# Patient Record
Sex: Female | Born: 1965 | Race: Black or African American | Hispanic: No | Smoking: Never smoker
Health system: Southern US, Community
[De-identification: ages and names within clinical notes are randomized; demographics above are authoritative.]

## PROBLEM LIST (undated history)

## (undated) HISTORY — PX: APPENDECTOMY: SHX54

## (undated) HISTORY — PX: JOINT REPLACEMENT: SHX530

## (undated) HISTORY — PX: BACK SURGERY: SHX140

---

## 2005-02-02 ENCOUNTER — Ambulatory Visit: Payer: Self-pay | Admitting: General Practice

## 2005-03-02 ENCOUNTER — Ambulatory Visit: Payer: Self-pay | Admitting: General Practice

## 2006-05-24 ENCOUNTER — Ambulatory Visit: Payer: Self-pay | Admitting: General Surgery

## 2006-06-02 ENCOUNTER — Ambulatory Visit: Payer: Self-pay | Admitting: General Surgery

## 2013-05-21 ENCOUNTER — Emergency Department: Payer: Self-pay | Admitting: Emergency Medicine
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
## 2015-08-21 ENCOUNTER — Ambulatory Visit
Admission: RE | Admit: 2015-08-21 | Discharge: 2015-08-21 | Disposition: A | Discharge status: Home / Self Care | Payer: BLUE CROSS/BLUE SHIELD | Source: Ambulatory Visit | Attending: Obstetrics and Gynecology | Admitting: Obstetrics and Gynecology

## 2015-08-21 DIAGNOSIS — R002 Palpitations: Secondary | ICD-10-CM | POA: Insufficient documentation

## 2016-10-06 ENCOUNTER — Ambulatory Visit
Admission: EM | Admit: 2016-10-06 | Discharge: 2016-10-06 | Disposition: A | Discharge status: Home / Self Care | Payer: BLUE CROSS/BLUE SHIELD | Attending: Family Medicine | Admitting: Family Medicine

## 2016-10-06 ENCOUNTER — Encounter: Payer: Self-pay | Admitting: *Deleted

## 2016-10-06 DIAGNOSIS — M722 Plantar fascial fibromatosis: Secondary | ICD-10-CM

## 2016-10-06 DIAGNOSIS — M79671 Pain in right foot: Secondary | ICD-10-CM

## 2016-10-06 MED ORDER — ETODOLAC 500 MG PO TABS
500.0000 mg | ORAL_TABLET | Freq: Two times a day (BID) | ORAL | 0 refills | Status: DC | PRN
Start: 2016-10-06 — End: 2018-08-05

## 2016-10-06 NOTE — ED Triage Notes (Signed)
Gradual onset right heel pain over past 2 weeks. Worse today. Denies injury.

## 2016-10-06 NOTE — Discharge Instructions (Signed)
Take medication as prescribed. Rest. Drink plenty of fluids. Ice. Wear good supportive shoes. Follow up with podiatry for continued pain.   Follow up with your primary care physician this week as needed. Return to Urgent care for new or worsening concerns.

## 2016-10-06 NOTE — ED Provider Notes (Signed)
[facility] ____________________________________________  Time seen: Approximately 7:59 PM  I have reviewed the triage vital signs and the nursing notes.   HISTORY  Chief Complaint Foot Pain   HPI Mindy Mack is a 51 y.o. female presenting for evaluation of right heel pain that is been present for just over 2 weeks. Patient states pain is a throbbing pain with intermittent sharp pains directly into the right heel. Denies any fall, injury or trauma. Reports that she does usually work 6-7 days a week on a hard surface while wearing stilted shoes. Denies history of similar in the past. Reports she does have a history of osteoarthritis with a past bilateral total knee replacements. Denies other chronic medical problems. Reports she has taken some over-the-counter Aleve without resolution. Has not been taking any medications on a regular basis. Reports has continued to work but did leave work early today. Denies any paresthesias, pain radiation, redness, insect bite, calf pain or leg swelling. Denies history of PE or DVT. Denies recent immobilization. Denies chest pain, shortness of breath, abdominal pain, dysuria,extremity swelling or rash. Denies recent sickness. Denies recent antibiotic use.   Patient's last menstrual period was 09/15/2016 (approximate).Denies pregnancy.    History reviewed. No pertinent past medical history. Osteoarthritis  There are no active problems to display for this patient.   Past Surgical History:  Procedure Laterality Date  . APPENDECTOMY    . CESAREAN SECTION    . JOINT REPLACEMENT       No current facility-administered medications for this encounter.   Current Outpatient Prescriptions:  .  etodolac (LODINE) 500 MG tablet, Take 1 tablet (500 mg total) by mouth 2 (two) times daily as needed (pain)., Disp: 14 tablet, Rfl: 0  Allergies Patient has no known allergies.  History reviewed. No pertinent family history.  Social  History Social History  Substance Use Topics  . Smoking status: Never Smoker  . Smokeless tobacco: Never Used  . Alcohol use Yes    Review of Systems Constitutional: No fever/chills Cardiovascular: Denies chest pain. Respiratory: Denies shortness of breath. Gastrointestinal: No abdominal pain.   Genitourinary: Negative for dysuria. Musculoskeletal: Negative for back pain. As above.  Skin: Negative for rash.   ____________________________________________   PHYSICAL EXAM:  VITAL SIGNS: ED Triage Vitals  Enc Vitals Group     BP 10/06/16 1921 119/65     Pulse Rate 10/06/16 1921 75     Resp 10/06/16 1921 16     Temp 10/06/16 1921 98.4 F (36.9 C)     Temp Source 10/06/16 1921 Oral     SpO2 10/06/16 1921 98 %     Weight 10/06/16 1922 265 lb (120.2 kg)     Height 10/06/16 1922 5\' 5"  (1.651 m)     Head Circumference --      Peak Flow --      Pain Score 10/06/16 1924 8     Pain Loc --      Pain Edu? --      Excl. in [place]? --     Constitutional: Alert and oriented. Well appearing and in no acute distress. Cardiovascular: Normal rate, regular rhythm. Grossly normal heart sounds.  Good peripheral circulation. Respiratory: Normal respiratory effort without tachypnea nor retractions. Breath sounds are clear and equal bilaterally. No wheezes, rales, rhonchi. Gastrointestinal: Soft and nontender Musculoskeletal: No midline cervical, thoracic or lumbar tenderness to palpation. Bilateral pedal pulses equal and easily palpated. Except: Right plantar aspect of right heel point mild to moderate  tenderness to direct palpation, no malleolar tenderness, no calf tenderness bilaterally, no edema noted to bilateral distal lower extremities, no Achilles tenderness, full range of motion present, right foot and right lower extremity otherwise nontender, right foot normal distal sensation and capillary refill. Neurologic:  Normal speech and language.  Speech is normal. No gait instability.  Skin:   Skin is warm, dry Psychiatric: Mood and affect are normal. Speech and behavior are normal. Patient exhibits appropriate insight and judgment   ___________________________________________   LABS (all labs ordered are listed, but only abnormal results are displayed)  Labs Reviewed - No data to display  RADIOLOGY  No results found. ____________________________________________   PROCEDURES Procedures    INITIAL IMPRESSION / ASSESSMENT AND PLAN / ED COURSE  Pertinent labs & imaging results that were available during my care of the patient were reviewed by me and considered in my medical decision making (see chart for details).  Plan patient. No acute distress. Denies trauma or injury. Suspect right plantar fasciitis. Discussed with patient, patient declines x-ray at this time and will seek x-ray evaluation if pain continues. Discussed with patient supportive and conservative management and including good supportive shoes, splints, stretching, ice and will treat with twice daily etodolac. Discussed patient follow-up with podiatry as needed for continued pain. Work note given for today and tomorrow.Discussed indication, risks and benefits of medications with patient.  Discussed follow up with Primary care physician this week. Discussed follow up and return parameters including no resolution or any worsening concerns. Patient verbalized understanding and agreed to plan.   ____________________________________________   FINAL CLINICAL IMPRESSION(S) / ED DIAGNOSES  Final diagnoses:  Plantar fasciitis of right foot     New Prescriptions   ETODOLAC (LODINE) 500 MG TABLET    Take 1 tablet (500 mg total) by mouth 2 (two) times daily as needed (pain).    Note: This dictation was prepared with Dragon dictation along with smaller phrase technology. Any transcriptional errors that result from this process are unintentional.         Marylene Land, NP 10/06/16 2055

## 2017-04-27 ENCOUNTER — Other Ambulatory Visit: Payer: Self-pay

## 2017-04-27 ENCOUNTER — Emergency Department
Admission: EM | Admit: 2017-04-27 | Discharge: 2017-04-27 | Disposition: A | Discharge status: Home / Self Care | Payer: BLUE CROSS/BLUE SHIELD | Attending: Emergency Medicine | Admitting: Emergency Medicine

## 2017-04-27 DIAGNOSIS — Z9660 Presence of unspecified orthopedic joint implant: Secondary | ICD-10-CM | POA: Insufficient documentation

## 2017-04-27 DIAGNOSIS — R509 Fever, unspecified: Secondary | ICD-10-CM | POA: Diagnosis present

## 2017-04-27 DIAGNOSIS — J111 Influenza due to unidentified influenza virus with other respiratory manifestations: Secondary | ICD-10-CM

## 2017-04-27 DIAGNOSIS — Z79899 Other long term (current) drug therapy: Secondary | ICD-10-CM | POA: Insufficient documentation

## 2017-04-27 MED ORDER — OSELTAMIVIR PHOSPHATE 75 MG PO CAPS: 75.0000 mg | ORAL_CAPSULE | Freq: Two times a day (BID) | ORAL | 0 refills | Status: AC

## 2017-04-27 MED ORDER — BENZONATATE 100 MG PO CAPS: 100.0000 mg | ORAL_CAPSULE | Freq: Three times a day (TID) | ORAL | 0 refills | Status: AC | PRN

## 2017-04-27 NOTE — ED Triage Notes (Signed)
Pt states sunday started with sore throat and cold symptoms. Taking nyquil and dayquil. Sounds congested. Body aches. HA. Decreased appetite, body aches. Coughing. Diarrhea last night, states vomited today. Alert, oriented, ambulatory. Speaking in complete sentences, no distress noted.

## 2017-04-27 NOTE — ED Notes (Signed)
See triage note  Presents with runny nose,fever and chills since Sunday night at work states she has tried OTC meds w/o much relief  afebrile on arrival

## 2017-04-27 NOTE — ED Provider Notes (Signed)
[facility] Emergency Department Provider Note  ____________________________________________  Time seen: Approximately 6:51 PM  I have reviewed the triage vital signs and the nursing notes.   HISTORY  Chief Complaint Influenza    HPI Lavelle Berland is a 52 y.o. female presents to the emergency department with rhinorrhea, headache, congestion, non-productive cough, myalgias, fever and chills for the past two days. No recent travel. Patient denies chest pain, chest tightness and vomiting. No alleviating measures have been attempted.    History reviewed. No pertinent past medical history.  There are no active problems to display for this patient.   Past Surgical History:  Procedure Laterality Date  . APPENDECTOMY    . CESAREAN SECTION    . JOINT REPLACEMENT      Prior to Admission medications   Medication Sig Start Date End Date Taking? Authorizing Provider  benzonatate (TESSALON PERLES) 100 MG capsule Take 1 capsule (100 mg total) by mouth 3 (three) times daily as needed for up to 7 days for cough. 04/27/17 05/04/17  Lannie Fields, PA-C  etodolac (LODINE) 500 MG tablet Take 1 tablet (500 mg total) by mouth 2 (two) times daily as needed (pain). 10/06/16   Marylene Land, NP  oseltamivir (TAMIFLU) 75 MG capsule Take 1 capsule (75 mg total) by mouth 2 (two) times daily for 5 days. 04/27/17 05/02/17  Lannie Fields, PA-C    Allergies Patient has no known allergies.  History reviewed. No pertinent family history.  Social History Social History   Tobacco Use  . Smoking status: Never Smoker  . Smokeless tobacco: Never Used  Substance Use Topics  . Alcohol use: Yes  . Drug use: No     Review of Systems  Constitutional: Patient has fever.  Eyes: No visual changes. No discharge ENT: Patient has congestion.  Cardiovascular: no chest pain. Respiratory: Patient has cough.  Gastrointestinal: No abdominal pain.  No nausea, no vomiting. Patient  had diarrhea.  Genitourinary: Negative for dysuria. No hematuria Musculoskeletal: Patient has myalgias.  Skin: Negative for rash, abrasions, lacerations, ecchymosis. Neurological: Patient has headache, no focal weakness or numbness.     ____________________________________________   PHYSICAL EXAM:  VITAL SIGNS: ED Triage Vitals [04/27/17 1425]  Enc Vitals Group     BP 134/82     Pulse Rate 87     Resp 16     Temp 98.2 F (36.8 C)     Temp Source Oral     SpO2 98 %     Weight 275 lb (124.7 kg)     Height 5\' 5"  (1.651 m)     Head Circumference      Peak Flow      Pain Score 3     Pain Loc      Pain Edu?      Excl. in [place]?      Constitutional: Alert and oriented. Patient is lying supine. Eyes: Conjunctivae are normal. PERRL. EOMI. Head: Atraumatic. ENT:      Ears: Tympanic membranes are mildly injected with mild effusion bilaterally.       Nose: No congestion/rhinnorhea.      Mouth/Throat: Mucous membranes are moist. Posterior pharynx is mildly erythematous.  Hematological/Lymphatic/Immunilogical: No cervical lymphadenopathy.  Cardiovascular: Normal rate, regular rhythm. Normal S1 and S2.  Good peripheral circulation. Respiratory: Normal respiratory effort without tachypnea or retractions. Lungs CTAB. Good air entry to the bases with no decreased or absent breath sounds. Gastrointestinal: Bowel sounds 4 quadrants. Soft and nontender to  palpation. No guarding or rigidity. No palpable masses. No distention. No CVA tenderness. Musculoskeletal: Full range of motion to all extremities. No gross deformities appreciated. Neurologic:  Normal speech and language. No gross focal neurologic deficits are appreciated.  Skin:  Skin is warm, dry and intact. No rash noted. Psychiatric: Mood and affect are normal. Speech and behavior are normal. Patient exhibits appropriate insight and judgement.    ____________________________________________   LABS (all labs ordered are listed,  but only abnormal results are displayed)  Labs Reviewed - No data to display ____________________________________________  EKG   ____________________________________________  RADIOLOGY   No results found.  ____________________________________________    PROCEDURES  Procedure(s) performed:    Procedures    Medications - No data to display   ____________________________________________   INITIAL IMPRESSION / ASSESSMENT AND PLAN / ED COURSE  Pertinent labs & imaging results that were available during my care of the patient were reviewed by me and considered in my medical decision making (see chart for details).  Review of the [place] CSRS was performed in accordance of the [place] prior to dispensing any controlled drugs.     Assessment and Plan:  Influenza A:  Patient presents to the emergency department with headache, rhinorrhea, congestion, pharyngitis and body aches.  Differential diagnosis included influenza versus unspecified viral URI.  History and physical exam findings are consistent with influenza at this time.  Patient was discharged with Tamiflu.  Rest and hydration were encouraged.  Vital signs are reassuring prior to discharge.  All patient questions were answered.    ____________________________________________  FINAL CLINICAL IMPRESSION(S) / ED DIAGNOSES  Final diagnoses:  Influenza      NEW MEDICATIONS STARTED DURING THIS VISIT:  ED Discharge Orders        Ordered    benzonatate (TESSALON PERLES) 100 MG capsule  3 times daily PRN     04/27/17 1611    oseltamivir (TAMIFLU) 75 MG capsule  2 times daily     04/27/17 1611          This chart was dictated using voice recognition software/Dragon. Despite best efforts to proofread, errors can occur which can change the meaning. Any change was purely unintentional.    Lannie Fields, PA-C 04/27/17 1858    Orbie Pyo, MD 04/27/17 2200

## 2018-08-05 ENCOUNTER — Encounter: Payer: Self-pay | Admitting: Emergency Medicine

## 2018-08-05 ENCOUNTER — Ambulatory Visit
Admission: EM | Admit: 2018-08-05 | Discharge: 2018-08-05 | Disposition: A | Discharge status: Home / Self Care | Payer: BLUE CROSS/BLUE SHIELD | Attending: Emergency Medicine | Admitting: Emergency Medicine

## 2018-08-05 ENCOUNTER — Other Ambulatory Visit: Payer: Self-pay

## 2018-08-05 DIAGNOSIS — L237 Allergic contact dermatitis due to plants, except food: Secondary | ICD-10-CM

## 2018-08-05 MED ORDER — PREDNISONE 10 MG PO TABS: ORAL_TABLET | ORAL | 0 refills | Status: DC

## 2018-08-05 NOTE — ED Triage Notes (Signed)
Pt c/o rash that she thinks is poison oak. Located on bilateral forearms. Started about 3 days ago. She was outside doing yard work the day before this started.

## 2018-08-05 NOTE — Discharge Instructions (Addendum)
Take medication as prescribed. Keep clean. Avoid trigger. Avoid scratching.   Follow up with your primary care physician this week as needed. Return to Urgent care for new or worsening concerns.

## 2018-08-05 NOTE — ED Provider Notes (Signed)
[facility] ____________________________________________  Time seen: Approximately 1:16 PM  I have reviewed the triage vital signs and the nursing notes.   HISTORY  Chief Complaint Rash   HPI Mindy Mack is a 53 y.o. female for evaluation of itchy rash to bilateral forearms that is starting to spread.  States rash is been present for the last 3 to 4 days.  States that she was working in her yard wearing gloves and cut down vines and pulling poison oak and ivy.  States has been putting some topical over-the-counter poison ivy cream without resolution.  Continues to spread.  Very itchy.  Denies pain.  No rash.  Not diabetic.  Denies renal insufficiency.  Reports otherwise doing well.   History reviewed. No pertinent past medical history.  There are no active problems to display for this patient.   Past Surgical History:  Procedure Laterality Date  . APPENDECTOMY    . CESAREAN SECTION    . JOINT REPLACEMENT       No current facility-administered medications for this encounter.   Current Outpatient Medications:  .  predniSONE (DELTASONE) 10 MG tablet, Take 60mg  orally day one, then 55 mg orally day two, then 50 mg orally day three, then taper by 5 mg daily over 12 days until complete., Disp: 35 tablet, Rfl: 0  Allergies Patient has no known allergies.  Family History  Problem Relation Age of Onset  . Healthy Mother   . Dementia Father     Social History Social History   Tobacco Use  . Smoking status: Never Smoker  . Smokeless tobacco: Never Used  Substance Use Topics  . Alcohol use: Yes  . Drug use: No    Review of Systems Constitutional: No fever Cardiovascular: Denies chest pain. Respiratory: Denies shortness of breath. Gastrointestinal: No abdominal pain.   Musculoskeletal: Negative for back pain. Skin: Positive for rash.  ____________________________________________   PHYSICAL EXAM:  VITAL SIGNS: ED Triage Vitals  Enc Vitals  Group     BP 08/05/18 1221 126/76     Pulse Rate 08/05/18 1221 60     Resp 08/05/18 1221 18     Temp 08/05/18 1221 97.9 F (36.6 C)     Temp Source 08/05/18 1221 Oral     SpO2 08/05/18 1221 99 %     Weight 08/05/18 1219 280 lb (127 kg)     Height 08/05/18 1219 5\' 5"  (1.651 m)     Head Circumference --      Peak Flow --      Pain Score 08/05/18 1219 0     Pain Loc --      Pain Edu? --      Excl. in [place]? --     Constitutional: Alert and oriented. Well appearing and in no acute distress. ENT      Head: Normocephalic and atraumatic. Cardiovascular: Normal rate, regular rhythm. Grossly normal heart sounds.  Good peripheral circulation. Respiratory: Normal respiratory effort without tachypnea nor retractions. Breath sounds are clear and equal bilaterally. No wheezes, rales, rhonchi. Musculoskeletal:  Steady gait.  Neurologic:  Normal speech and language.  Speech is normal. No gait instability.  Skin:  Skin is warm, dry.  Except: Pruritic vesicular rash present to bilateral distal arms and to left abdomen, no surrounding erythema, nontender, no edema. Psychiatric: Mood and affect are normal. Speech and behavior are normal. Patient exhibits appropriate insight and judgment   ___________________________________________   LABS (all labs ordered are listed, but only abnormal  results are displayed)  Labs Reviewed - No data to display   PROCEDURES Procedures     INITIAL IMPRESSION / ASSESSMENT AND PLAN / ED COURSE  Pertinent labs & imaging results that were available during my care of the patient were reviewed by me and considered in my medical decision making (see chart for details).  Well-appearing patient.  No acute distress.  Rash consistent with contact dermatitis from plant.  Will treat with prednisone taper.  Supportive care, avoid trigger.Discussed indication, risks and benefits of medications with patient.  Discussed follow up with Primary care physician this week.  Discussed follow up and return parameters including no resolution or any worsening concerns. Patient verbalized understanding and agreed to plan.   ____________________________________________   FINAL CLINICAL IMPRESSION(S) / ED DIAGNOSES  Final diagnoses:  Allergic contact dermatitis due to plants, except food     ED Discharge Orders         Ordered    predniSONE (DELTASONE) 10 MG tablet     08/05/18 1229           Note: This dictation was prepared with Dragon dictation along with smaller phrase technology. Any transcriptional errors that result from this process are unintentional.         Marylene Land, NP 08/05/18 1319

## 2018-09-06 ENCOUNTER — Other Ambulatory Visit: Payer: Self-pay

## 2018-09-06 ENCOUNTER — Emergency Department: Payer: BC Managed Care – PPO

## 2018-09-06 ENCOUNTER — Encounter: Payer: Self-pay | Admitting: Emergency Medicine

## 2018-09-06 ENCOUNTER — Emergency Department
Admission: EM | Admit: 2018-09-06 | Discharge: 2018-09-06 | Disposition: A | Discharge status: Home / Self Care | Payer: BC Managed Care – PPO | Attending: Emergency Medicine | Admitting: Emergency Medicine

## 2018-09-06 DIAGNOSIS — R51 Headache: Secondary | ICD-10-CM | POA: Diagnosis not present

## 2018-09-06 DIAGNOSIS — R0602 Shortness of breath: Secondary | ICD-10-CM | POA: Insufficient documentation

## 2018-09-06 DIAGNOSIS — R519 Headache, unspecified: Secondary | ICD-10-CM

## 2018-09-06 LAB — BASIC METABOLIC PANEL
Anion gap: 8 (ref 5–15)
BUN: 15 mg/dL (ref 6–20)
CO2: 24 mmol/L (ref 22–32)
Calcium: 8.8 mg/dL — ABNORMAL LOW (ref 8.9–10.3)
Chloride: 105 mmol/L (ref 98–111)
Creatinine, Ser: 0.6 mg/dL (ref 0.44–1.00)
GFR calc Af Amer: >60 mL/min (ref >60)
GFR calc non Af Amer: >60 mL/min (ref >60)
Glucose, Bld: 116 mg/dL — ABNORMAL HIGH (ref 70–99)
Potassium: 3.4 mmol/L — ABNORMAL LOW (ref 3.5–5.1)
Sodium: 137 mmol/L (ref 135–145)

## 2018-09-06 LAB — CBC
HCT: 41.5 % (ref 36.0–46.0)
Hemoglobin: 13.8 g/dL (ref 12.0–15.0)
MCH: 29.9 pg (ref 26.0–34.0)
MCHC: 33.3 g/dL (ref 30.0–36.0)
MCV: 89.8 fL (ref 80.0–100.0)
Platelets: 268 10*3/uL (ref 150–400)
RBC: 4.62 MIL/uL (ref 3.87–5.11)
RDW: 12.2 % (ref 11.5–15.5)
WBC: 4.4 10*3/uL (ref 4.0–10.5)
nRBC: 0 % (ref 0.0–0.2)

## 2018-09-06 NOTE — Discharge Instructions (Signed)
COVID testing should be resulted within the next 2 days. Quarantine yourself until your results are back. Return to the emergency department for any symptom of concern if you are unable to schedule an appointment with your primary care provider.

## 2018-09-06 NOTE — ED Triage Notes (Signed)
Pt in via POV, reports shortness of breath and headache x one day.  States she received a call Monday from her employer to let her know that someone had been tested without results at this time and she was to stay out of work until otherwise noted.  Ambulatory to triage, NAD noted at this time.

## 2018-09-06 NOTE — ED Provider Notes (Signed)
[facility] Emergency Department Provider Note  ____________________________________________   First MD Initiated Contact with Patient 09/06/18 1606     (approximate)  I have reviewed the triage vital signs and the nursing notes.   HISTORY  Chief Complaint Shortness of Breath and Headache   HPI Mindy Mack is a 53 y.o. female who presents to the emergency department for COVID-19 testing. She could have possibly had an exposure by a co worker who is awaiting test results. Patient states that once she heard that she may have been exposed, she too began to notice some mild shortness of breath. She has been able to continue her ADL without difficulty. Beginning yesterday, she has had a headache. No OTC medications. She is unable to go back to work until she has a negative test or quarantine for 14 days.    History reviewed. No pertinent past medical history.  There are no active problems to display for this patient.   Past Surgical History:  Procedure Laterality Date  . APPENDECTOMY    . CESAREAN SECTION    . JOINT REPLACEMENT      Prior to Admission medications   Medication Sig Start Date End Date Taking? Authorizing Provider  predniSONE (DELTASONE) 10 MG tablet Take 60mg  orally day one, then 55 mg orally day two, then 50 mg orally day three, then taper by 5 mg daily over 12 days until complete. 08/05/18   Marylene Land, NP    Allergies Patient has no known allergies.  Family History  Problem Relation Age of Onset  . Healthy Mother   . Dementia Father     Social History Social History   Tobacco Use  . Smoking status: Never Smoker  . Smokeless tobacco: Never Used  Substance Use Topics  . Alcohol use: Yes  . Drug use: No    Review of Systems  Constitutional: No fever/chills Eyes: No visual changes. ENT: No sore throat. Cardiovascular: Denies chest pain. Respiratory: Positive for shortness of breath. Gastrointestinal: No  abdominal pain.  No nausea, no vomiting.  No diarrhea.  No constipation. Genitourinary: Negative for dysuria. Musculoskeletal: Negative for back pain. Skin: Negative for rash. Neurological: Positive for headaches, focal weakness or numbness. ____________________________________________   PHYSICAL EXAM:  VITAL SIGNS: ED Triage Vitals  Enc Vitals Group     BP 09/06/18 1442 137/65     Pulse Rate 09/06/18 1442 71     Resp 09/06/18 1442 16     Temp 09/06/18 1442 98.4 F (36.9 C)     Temp Source 09/06/18 1442 Oral     SpO2 09/06/18 1442 96 %     Weight 09/06/18 1436 287 lb (130.2 kg)     Height 09/06/18 1436 5\' 5"  (1.651 m)     Head Circumference --      Peak Flow --      Pain Score 09/06/18 1435 5     Pain Loc --      Pain Edu? --      Excl. in [place]? --     Constitutional: Alert and oriented. Well appearing and in no acute distress. Eyes: Conjunctivae are normal. PERRL. EOMI. Head: Atraumatic. Nose: No congestion/rhinnorhea. Mouth/Throat: Mucous membranes are moist.  Oropharynx non-erythematous. Neck: No stridor.   Cardiovascular: Normal rate, regular rhythm. Grossly normal heart sounds.  Good peripheral circulation. Respiratory: Normal respiratory effort.  No retractions. Lungs CTAB. Gastrointestinal: Soft and nontender. No distention. No abdominal bruits. No CVA tenderness. Musculoskeletal: No lower extremity tenderness  nor edema.  No joint effusions. Neurologic:  Normal speech and language. No gross focal neurologic deficits are appreciated. No gait instability. Skin:  Skin is warm, dry and intact. No rash noted. Psychiatric: Mood and affect are normal. Speech and behavior are normal.  ____________________________________________   LABS (all labs ordered are listed, but only abnormal results are displayed)  Labs Reviewed  BASIC METABOLIC PANEL - Abnormal; Notable for the following components:      Result Value   Potassium 3.4 (*)    Glucose, Bld 116 (*)    Calcium  8.8 (*)    All other components within normal limits  NOVEL CORONAVIRUS, NAA (HOSPITAL ORDER, SEND-OUT TO REF LAB)  CBC   ____________________________________________  EKG  ED ECG REPORT I, Canesha Tesfaye, FNP-BC personally viewed and interpreted this ECG.   Date: 09/06/2018  EKG Time: 1436  Rate: 74  Rhythm: normal sinus rhythm  Axis: normal  Intervals:none  ST&T Change: no ST elevation.  ____________________________________________  RADIOLOGY  ED MD interpretation:  Chest x-ray is negative for acute cardiopulmonary abnormality per radiology.  Official radiology report(s): Dg Chest 2 View  Result Date: 09/06/2018 CLINICAL DATA:  Shortness of breath. EXAM: CHEST - 2 VIEW COMPARISON:  Radiographs of May 21, 2013. FINDINGS: The heart size and mediastinal contours are within normal limits. Both lungs are clear. No pneumothorax or pleural effusion is noted. The visualized skeletal structures are unremarkable. IMPRESSION: No active cardiopulmonary disease. Electronically Signed   By: Marijo Conception M.D.   On: 09/06/2018 15:36    ____________________________________________   PROCEDURES  Procedure(s) performed: None  Procedures  Critical Care performed: No  ____________________________________________   INITIAL IMPRESSION / ASSESSMENT AND PLAN / ED COURSE   53 year old female presenting to the emergency department after a potential exposure to COVID-19.  A coworker is awaiting test results.  Patient's employer will not allow her to return to work until her [place] test is negative or until she quarantine's for 14 days.  She does not work in [employer].  Vital signs, labs, and assessment are all reassuring.  Send out COVID testing has been requested.  Patient will be discharged home and encouraged to return to the emergency department if she develops symptoms of concern.       ____________________________________________   FINAL CLINICAL IMPRESSION(S) / ED  DIAGNOSES  Final diagnoses:  Shortness of breath  Intractable headache, unspecified chronicity pattern, unspecified headache type     ED Discharge Orders    None       Note:  This document was prepared using Dragon voice recognition software and may include unintentional dictation errors.    Victorino Dike, FNP 09/06/18 1615    Delman Kitten, MD 09/07/18 [phone]

## 2018-09-06 NOTE — ED Notes (Signed)
Pt alert and oriented X4, active, cooperative, pt in NAD. RR even and unlabored, color WNL.  Pt informed to return if any life threatening symptoms occur.  Discharge and followup instructions reviewed. Ambulates safely. 

## 2019-01-19 ENCOUNTER — Ambulatory Visit
Admission: EM | Admit: 2019-01-19 | Discharge: 2019-01-19 | Disposition: A | Discharge status: Home / Self Care | Payer: BC Managed Care – PPO | Attending: Family Medicine | Admitting: Family Medicine

## 2019-01-19 ENCOUNTER — Encounter: Payer: Self-pay | Admitting: Emergency Medicine

## 2019-01-19 ENCOUNTER — Other Ambulatory Visit: Payer: Self-pay

## 2019-01-19 DIAGNOSIS — L237 Allergic contact dermatitis due to plants, except food: Secondary | ICD-10-CM

## 2019-01-19 MED ORDER — PREDNISONE 20 MG PO TABS: ORAL_TABLET | ORAL | 0 refills | Status: DC

## 2019-01-19 NOTE — ED Provider Notes (Signed)
[facility]    CSN: [id number] Arrival date & time: 01/19/19  1751      History   Chief Complaint Chief Complaint  Patient presents with  . Rash    HPI Mindy Mack is a 53 y.o. female.   53 yo female with a c/o red, blistery, itchy rash on her left forearm for the past 5 days after exposure to poison oak. Denies any fevers, chills. Has tried over the counter medications without relief. States itching is intense and today she rubbed the area.    Rash   History reviewed. No pertinent past medical history.  There are no active problems to display for this patient.   Past Surgical History:  Procedure Laterality Date  . APPENDECTOMY    . CESAREAN SECTION    . JOINT REPLACEMENT      OB History   No obstetric history on file.      Home Medications    Prior to Admission medications   Medication Sig Start Date End Date Taking? Authorizing Provider  predniSONE (DELTASONE) 20 MG tablet 2 tabs po qd x 2 days, then 1 tab po qd x 3 days, the half a tab po qd x 3 days 01/19/19   Norval Gable, MD    Family History Family History  Problem Relation Age of Onset  . Healthy Mother   . Dementia Father     Social History Social History   Tobacco Use  . Smoking status: Never Smoker  . Smokeless tobacco: Never Used  Substance Use Topics  . Alcohol use: Yes  . Drug use: No     Allergies   Patient has no known allergies.   Review of Systems Review of Systems  Skin: Positive for rash.     Physical Exam Triage Vital Signs ED Triage Vitals  Enc Vitals Group     BP 01/19/19 1803 133/73     Pulse Rate 01/19/19 1803 72     Resp 01/19/19 1803 16     Temp 01/19/19 1803 98.4 F (36.9 C)     Temp Source 01/19/19 1803 Oral     SpO2 01/19/19 1803 97 %     Weight 01/19/19 1801 295 lb (133.8 kg)     Height 01/19/19 1801 5\' 5"  (1.651 m)     Head Circumference --      Peak Flow --      Pain Score 01/19/19 1801 0     Pain Loc --      Pain  Edu? --      Excl. in [place]? --    No data found.  Updated Vital Signs BP 133/73 (BP Location: Right Arm)   Pulse 72   Temp 98.4 F (36.9 C) (Oral)   Resp 16   Ht 5\' 5"  (1.651 m)   Wt 133.8 kg   SpO2 97%   BMI 49.09 kg/m   Visual Acuity Right Eye Distance:   Left Eye Distance:   Bilateral Distance:    Right Eye Near:   Left Eye Near:    Bilateral Near:     Physical Exam Vitals signs and nursing note reviewed.  Constitutional:      General: She is not in acute distress.    Appearance: She is not toxic-appearing or diaphoretic.  Skin:    Findings: Rash (left forearm with erythematous, vesicular rash ) present.  Neurological:     Mental Status: She is alert.      UC Treatments /  Results  Labs (all labs ordered are listed, but only abnormal results are displayed) Labs Reviewed - No data to display  EKG   Radiology No results found.  Procedures Procedures (including critical care time)  Medications Ordered in UC Medications - No data to display  Initial Impression / Assessment and Plan / UC Course  I have reviewed the triage vital signs and the nursing notes.  Pertinent labs & imaging results that were available during my care of the patient were reviewed by me and considered in my medical decision making (see chart for details).     Final Clinical Impressions(s) / UC Diagnoses   Final diagnoses:  Contact dermatitis due to poison oak   Discharge Instructions   None    ED Prescriptions    Medication Sig Dispense Auth. Provider   predniSONE (DELTASONE) 20 MG tablet 2 tabs po qd x 2 days, then 1 tab po qd x 3 days, the half a tab po qd x 3 days 9 tablet Tika Hannis, MD      1. diagnosis reviewed with patient 2. rx as per orders above; reviewed possible side effects, interactions, risks and benefits  3. Recommend supportive treatment with otc antihistamines prn 4. Follow-up prn if symptoms worsen or don't improve  PDMP not reviewed this  encounter.   Norval Gable, MD 01/19/19 1902

## 2019-01-19 NOTE — ED Triage Notes (Signed)
Patient c/o itchy rash on her arms that started last Saturday.

## 2019-10-02 ENCOUNTER — Other Ambulatory Visit: Payer: Self-pay

## 2019-10-02 ENCOUNTER — Ambulatory Visit
Admission: EM | Admit: 2019-10-02 | Discharge: 2019-10-02 | Disposition: A | Discharge status: Home / Self Care | Payer: 59 | Attending: Family Medicine | Admitting: Family Medicine

## 2019-10-02 DIAGNOSIS — M436 Torticollis: Secondary | ICD-10-CM | POA: Diagnosis not present

## 2019-10-02 MED ORDER — TIZANIDINE HCL 4 MG PO TABS: 4.0000 mg | ORAL_TABLET | Freq: Three times a day (TID) | ORAL | 0 refills | Status: DC | PRN

## 2019-10-02 MED ORDER — KETOROLAC TROMETHAMINE 10 MG PO TABS: 10.0000 mg | ORAL_TABLET | Freq: Four times a day (QID) | ORAL | 0 refills | Status: DC | PRN

## 2019-10-02 NOTE — Discharge Instructions (Signed)
Lots of heat.  Medication as prescribed.  Take care  Dr. Kiaan Overholser  

## 2019-10-02 NOTE — ED Provider Notes (Signed)
[facility]    CSN: [id number] Arrival date & time: 10/02/19  7829      History   Chief Complaint Chief Complaint  Patient presents with  . Neck Pain   HPI  54 year old female presents with neck pain.  Started on Saturday.  Right-sided.  States that it started after she woke from sleep.  She states that she believes that she may have slept wrong.  The pain has continued to persist and has not improved despite Biofreeze, topical lidocaine, naproxen.  She has difficulty moving her head to the right.  No radicular symptoms.  Pain 9/10 in severity.  Exacerbated by movement.  No other associated symptoms.  No other complaints.  Past Surgical History:  Procedure Laterality Date  . APPENDECTOMY    . CESAREAN SECTION    . JOINT REPLACEMENT     OB History   No obstetric history on file.    Home Medications    Prior to Admission medications   Medication Sig Start Date End Date Taking? Authorizing Provider  ketorolac (TORADOL) 10 MG tablet Take 1 tablet (10 mg total) by mouth every 6 (six) hours as needed for moderate pain or severe pain. 10/02/19   Coral Spikes, DO  tiZANidine (ZANAFLEX) 4 MG tablet Take 1 tablet (4 mg total) by mouth every 8 (eight) hours as needed for muscle spasms. 10/02/19   Coral Spikes, DO    Family History Family History  Problem Relation Age of Onset  . Healthy Mother   . Dementia Father     Social History Social History   Tobacco Use  . Smoking status: Never Smoker  . Smokeless tobacco: Never Used  Vaping Use  . Vaping Use: Never used  Substance Use Topics  . Alcohol use: Yes    Comment: occasionally  . Drug use: No     Allergies   Patient has no known allergies.   Review of Systems Review of Systems  Constitutional: Negative.   Musculoskeletal: Positive for neck pain and neck stiffness.   Physical Exam Triage Vital Signs ED Triage Vitals  Enc Vitals Group     BP 10/02/19 0836 126/73     Pulse Rate 10/02/19 0836 72      Resp 10/02/19 0836 18     Temp 10/02/19 0836 98.2 F (36.8 C)     Temp Source 10/02/19 0836 Oral     SpO2 10/02/19 0836 98 %     Weight 10/02/19 0835 (!) 300 lb (136.1 kg)     Height 10/02/19 0835 5\' 5"  (1.651 m)     Head Circumference --      Peak Flow --      Pain Score 10/02/19 0834 9     Pain Loc --      Pain Edu? --      Excl. in [place]? --    Updated Vital Signs BP 126/73 (BP Location: Left Arm)   Pulse 72   Temp 98.2 F (36.8 C) (Oral)   Resp 18   Ht 5\' 5"  (1.651 m)   Wt (!) 136.1 kg   SpO2 98%   BMI 49.92 kg/m   Visual Acuity Right Eye Distance:   Left Eye Distance:   Bilateral Distance:    Right Eye Near:   Left Eye Near:    Bilateral Near:     Physical Exam Vitals and nursing note reviewed.  Constitutional:      General: She is not in acute distress.  Appearance: Normal appearance. She is not ill-appearing.  HENT:     Head: Normocephalic and atraumatic.  Eyes:     General:        Right eye: No discharge.        Left eye: No discharge.     Conjunctiva/sclera: Conjunctivae normal.  Neck:     Comments: Trapezius muscle spasm noted. Limited range of motion in right rotation. Cardiovascular:     Rate and Rhythm: Normal rate and regular rhythm.     Heart sounds: No murmur heard.   Pulmonary:     Effort: Pulmonary effort is normal.     Breath sounds: Normal breath sounds. No wheezing, rhonchi or rales.  Neurological:     Mental Status: She is alert.  Psychiatric:        Mood and Affect: Mood normal.        Behavior: Behavior normal.     UC Treatments / Results  Labs (all labs ordered are listed, but only abnormal results are displayed) Labs Reviewed - No data to display  EKG   Radiology No results found.  Procedures Procedures (including critical care time)  Medications Ordered in UC Medications - No data to display  Initial Impression / Assessment and Plan / UC Course  I have reviewed the triage vital signs and the nursing  notes.  Pertinent labs & imaging results that were available during my care of the patient were reviewed by me and considered in my medical decision making (see chart for details).     54 year old female presents with acute torticollis. Treated with Toradol and Zanaflex. Work note given.  Final Clinical Impressions(s) / UC Diagnoses   Final diagnoses:  Torticollis     Discharge Instructions     Lots of heat.  Medication as prescribed.  Take care  Dr. Lacinda Axon    ED Prescriptions    Medication Sig Dispense Auth. Provider   ketorolac (TORADOL) 10 MG tablet Take 1 tablet (10 mg total) by mouth every 6 (six) hours as needed for moderate pain or severe pain. 20 tablet Chanie Soucek G, DO   tiZANidine (ZANAFLEX) 4 MG tablet Take 1 tablet (4 mg total) by mouth every 8 (eight) hours as needed for muscle spasms. 30 tablet Coral Spikes, DO     PDMP not reviewed this encounter.   Coral Spikes, [place] 10/02/19 [phone]

## 2019-10-02 NOTE — ED Triage Notes (Signed)
Patient complains of right sided neck pain that started Saturday . States that she feels like she can't turn her neck to the right side. Reports that she has tried biofreeze and lidocaine without relief. Pain radiates down to shoulder.

## 2020-01-29 ENCOUNTER — Ambulatory Visit
Admission: EM | Admit: 2020-01-29 | Discharge: 2020-01-29 | Disposition: A | Discharge status: Home / Self Care | Payer: 59 | Attending: Physician Assistant | Admitting: Physician Assistant

## 2020-01-29 ENCOUNTER — Encounter: Payer: Self-pay | Admitting: Emergency Medicine

## 2020-01-29 ENCOUNTER — Other Ambulatory Visit: Payer: Self-pay

## 2020-01-29 DIAGNOSIS — H1032 Unspecified acute conjunctivitis, left eye: Secondary | ICD-10-CM

## 2020-01-29 MED ORDER — CIPROFLOXACIN HCL 0.3 % OP SOLN: OPHTHALMIC | 0 refills | Status: DC

## 2020-01-29 NOTE — ED Provider Notes (Signed)
[facility]    CSN: [id number] Arrival date & time: 01/29/20  0800      History   Chief Complaint Chief Complaint  Patient presents with  . Eye Problem    left    HPI Mindy Mack is a 54 y.o. female presenting for 2-day history of left eye redness and irritation.  She states it does not hurt but it feels different.  States that she has had some light yellow drainage and crusting in the morning when she wakes up.  Denies any sensitivity to light.  Patient wears glasses.  Denies any vision changes.  No associated fever, fatigue, sore throat, cough or congestion.  Denies any injury to the eye.  Patient has not been around anyone with similar symptoms.  Has not tried any over-the-counter products.  She has no other concerns today.  HPI  History reviewed. No pertinent past medical history.  There are no problems to display for this patient.   Past Surgical History:  Procedure Laterality Date  . APPENDECTOMY    . CESAREAN SECTION    . JOINT REPLACEMENT      OB History   No obstetric history on file.      Home Medications    Prior to Admission medications   Medication Sig Start Date End Date Taking? Authorizing Provider  ciprofloxacin (CILOXAN) 0.3 % ophthalmic solution Administer 1-2 drops, every 2 hours, while awake, for 2 days. Then 1-2 drops, every 4 hours, while awake, for the next 5 days. 01/29/20   Danton Clap, PA-C  ketorolac (TORADOL) 10 MG tablet Take 1 tablet (10 mg total) by mouth every 6 (six) hours as needed for moderate pain or severe pain. 10/02/19   Coral Spikes, DO  tiZANidine (ZANAFLEX) 4 MG tablet Take 1 tablet (4 mg total) by mouth every 8 (eight) hours as needed for muscle spasms. 10/02/19   Coral Spikes, DO    Family History Family History  Problem Relation Age of Onset  . Hypertension Mother   . Dementia Father   . Parkinson's disease Father     Social History Social History   Tobacco Use  . Smoking status: Never  Smoker  . Smokeless tobacco: Never Used  Vaping Use  . Vaping Use: Never used  Substance Use Topics  . Alcohol use: Yes    Comment: occasionally  . Drug use: No     Allergies   Patient has no known allergies.   Review of Systems Review of Systems  Constitutional: Negative for fatigue and fever.  HENT: Negative for rhinorrhea and sore throat.   Eyes: Positive for discharge and redness. Negative for photophobia, pain, itching and visual disturbance.  Respiratory: Negative for cough.   Skin: Negative for rash.  Neurological: Negative for dizziness and headaches.     Physical Exam Triage Vital Signs ED Triage Vitals [01/29/20 0814]  Enc Vitals Group     BP 126/76     Pulse Rate 79     Resp 18     Temp 98.3 F (36.8 C)     Temp Source Oral     SpO2 100 %     Weight 300 lb (136.1 kg)     Height 5\' 5"  (1.651 m)     Head Circumference      Peak Flow      Pain Score 0     Pain Loc      Pain Edu?      Excl.  in [place]?    No data found.  Updated Vital Signs BP 126/76 (BP Location: Left Arm)   Pulse 79   Temp 98.3 F (36.8 C) (Oral)   Resp 18   Ht 5\' 5"  (1.651 m)   Wt 300 lb (136.1 kg)   LMP 10/29/2019 (Approximate)   SpO2 100%   BMI 49.92 kg/m   Visual Acuity Right Eye Distance: 20/25 (corrected) Left Eye Distance: 20/20 (corrected) Bilateral Distance: 20/20 (corrected)  Right Eye Near:   Left Eye Near:    Bilateral Near:     Physical Exam Vitals and nursing note reviewed.  Constitutional:      General: She is not in acute distress.    Appearance: Normal appearance. She is not ill-appearing or toxic-appearing.  HENT:     Head: Normocephalic and atraumatic.     Nose: Nose normal.     Mouth/Throat:     Mouth: Mucous membranes are moist.     Pharynx: Oropharynx is clear.  Eyes:     General: No scleral icterus.    Extraocular Movements: Extraocular movements intact.     Conjunctiva/sclera:     Left eye: Left conjunctiva is injected (light yellowish  drainage noted).     Pupils: Pupils are equal, round, and reactive to light.  Cardiovascular:     Rate and Rhythm: Normal rate.  Pulmonary:     Effort: Pulmonary effort is normal. No respiratory distress.  Musculoskeletal:     Cervical back: Neck supple.  Skin:    General: Skin is dry.  Neurological:     General: No focal deficit present.     Mental Status: She is alert. Mental status is at baseline.     Motor: No weakness.     Gait: Gait normal.  Psychiatric:        Mood and Affect: Mood normal.        Behavior: Behavior normal.        Thought Content: Thought content normal.      UC Treatments / Results  Labs (all labs ordered are listed, but only abnormal results are displayed) Labs Reviewed - No data to display  EKG   Radiology No results found.  Procedures Procedures (including critical care time)  Medications Ordered in UC Medications - No data to display  Initial Impression / Assessment and Plan / UC Course  I have reviewed the triage vital signs and the nursing notes.  Pertinent labs & imaging results that were available during my care of the patient were reviewed by me and considered in my medical decision making (see chart for details).   On exam patient has conjunctival irritation and light yellow drainage.  Suspect bacterial conjunctivitis.  Treating with Cipro eyedrops.  Advised not to touch the eyes.  Advised that she can use over-the-counter redness relief drops in between doses of the Cipro drops.  Can try warm compresses or ice to see if that helps.  Tylenol or ibuprofen for pain relief if needed.  Should be getting better in a few days but if symptoms worsen she should return.   Final Clinical Impressions(s) / UC Diagnoses   Final diagnoses:  Acute bacterial conjunctivitis of left eye   Discharge Instructions   None    ED Prescriptions    Medication Sig Dispense Auth. Provider   ciprofloxacin (CILOXAN) 0.3 % ophthalmic solution Administer  1-2 drops, every 2 hours, while awake, for 2 days. Then 1-2 drops, every 4 hours, while awake, for the next 5  days. 5 mL Danton Clap, PA-C     PDMP not reviewed this encounter.   Danton Clap, PA-C 01/29/20 [phone]

## 2020-01-29 NOTE — ED Triage Notes (Signed)
Patient in today c/o left eye redness and irritation x 1 day. Patient has not used any OTC medications for her symptoms.

## 2020-06-17 ENCOUNTER — Other Ambulatory Visit: Payer: Self-pay

## 2020-06-17 ENCOUNTER — Ambulatory Visit
Admission: EM | Admit: 2020-06-17 | Discharge: 2020-06-17 | Disposition: A | Discharge status: Home / Self Care | Payer: 59 | Attending: Family Medicine | Admitting: Family Medicine

## 2020-06-17 DIAGNOSIS — R109 Unspecified abdominal pain: Secondary | ICD-10-CM | POA: Insufficient documentation

## 2020-06-17 DIAGNOSIS — R350 Frequency of micturition: Secondary | ICD-10-CM | POA: Diagnosis not present

## 2020-06-17 DIAGNOSIS — R10A2 Flank pain, left side: Secondary | ICD-10-CM

## 2020-06-17 DIAGNOSIS — R1032 Left lower quadrant pain: Secondary | ICD-10-CM | POA: Diagnosis present

## 2020-06-17 LAB — URINALYSIS, COMPLETE (UACMP) WITH MICROSCOPIC
Bilirubin Urine: NEGATIVE
Glucose, UA: NEGATIVE mg/dL
Hgb urine dipstick: NEGATIVE
Ketones, ur: NEGATIVE mg/dL
Leukocytes,Ua: NEGATIVE
Nitrite: NEGATIVE
Protein, ur: NEGATIVE mg/dL
Specific Gravity, Urine: 1.02 (ref 1.005–1.030)
pH: 7 (ref 5.0–8.0)

## 2020-06-17 MED ORDER — SULFAMETHOXAZOLE-TRIMETHOPRIM 800-160 MG PO TABS: 1.0000 | ORAL_TABLET | Freq: Two times a day (BID) | ORAL | 0 refills | Status: AC

## 2020-06-17 MED ORDER — DICLOFENAC SODIUM 75 MG PO TBEC: 75.0000 mg | DELAYED_RELEASE_TABLET | Freq: Two times a day (BID) | ORAL | 0 refills | Status: DC | PRN

## 2020-06-17 NOTE — ED Triage Notes (Signed)
Patient presents to Urgent Care with complaints of left groin and left lower back pain since Saturday. She states the pain started after doing lawn work. She is unsure if related to pulling or twisting the wrong way or UTI. Treating pain with aleve. She spoke with PCP instructed her to come in for evaluation.   Denies fever, abdominal pain, or urinary symptoms.

## 2020-06-17 NOTE — Discharge Instructions (Addendum)
Rest.  Lots of fluids.  Medication as prescribed.  Take care  Dr. Lacinda Axon

## 2020-06-17 NOTE — ED Provider Notes (Signed)
[facility]    CSN: [id number] Arrival date & time: 06/17/20  1015      History   Chief Complaint Chief Complaint  Patient presents with  . Back Pain   HPI  55 year old female presents with the above complaint.  Patient reports that she has had left-sided low back pain/flank pain as well as left groin pain since Saturday.  She states that this started after she push mowing the lawn.  Patient reports that she has had some ongoing urinary frequency.  Patient denies any fall, trauma, injury.  No relieving factors.  Pain 4/10 in severity.  No other complaints.  Past Surgical History:  Procedure Laterality Date  . APPENDECTOMY    . CESAREAN SECTION    . JOINT REPLACEMENT      OB History   No obstetric history on file.      Home Medications    Prior to Admission medications   Medication Sig Start Date End Date Taking? Authorizing Provider  diclofenac (VOLTAREN) 75 MG EC tablet Take 1 tablet (75 mg total) by mouth 2 (two) times daily as needed for moderate pain. 06/17/20  Yes Christan Defranco G, DO  sulfamethoxazole-trimethoprim (BACTRIM DS) 800-160 MG tablet Take 1 tablet by mouth 2 (two) times daily for 7 days. 06/17/20 06/24/20 Yes Coral Spikes, DO    Family History Family History  Problem Relation Age of Onset  . Hypertension Mother   . Dementia Father   . Parkinson's disease Father     Social History Social History   Tobacco Use  . Smoking status: Never Smoker  . Smokeless tobacco: Never Used  Vaping Use  . Vaping Use: Never used  Substance Use Topics  . Alcohol use: Yes    Comment: occasionally  . Drug use: No     Allergies   Patient has no known allergies.   Review of Systems Review of Systems Per HPI  Physical Exam Triage Vital Signs ED Triage Vitals  Enc Vitals Group     BP 06/17/20 1100 123/72     Pulse Rate 06/17/20 1100 62     Resp 06/17/20 1100 16     Temp 06/17/20 1100 98.2 F (36.8 C)     Temp Source 06/17/20 1100 Oral      SpO2 06/17/20 1100 98 %     Weight 06/17/20 1058 283 lb (128.4 kg)     Height --      Head Circumference --      Peak Flow --      Pain Score 06/17/20 1058 4     Pain Loc --      Pain Edu? --      Excl. in [place]? --    Updated Vital Signs BP 123/72 (BP Location: Right Arm)   Pulse 62   Temp 98.2 F (36.8 C) (Oral)   Resp 16   Wt 128.4 kg   SpO2 98%   BMI 47.09 kg/m   Visual Acuity Right Eye Distance:   Left Eye Distance:   Bilateral Distance:    Right Eye Near:   Left Eye Near:    Bilateral Near:     Physical Exam Vitals and nursing note reviewed.  Constitutional:      General: She is not in acute distress.    Appearance: Normal appearance. She is obese. She is not ill-appearing.  HENT:     Head: Normocephalic and atraumatic.  Eyes:     General:  Right eye: No discharge.        Left eye: No discharge.     Conjunctiva/sclera: Conjunctivae normal.  Cardiovascular:     Rate and Rhythm: Normal rate and regular rhythm.  Pulmonary:     Effort: Pulmonary effort is normal.     Breath sounds: Normal breath sounds. No wheezing, rhonchi or rales.  Abdominal:     General: There is no distension.     Palpations: Abdomen is soft.     Comments: Mild tenderness in the LLQ.  Neurological:     Mental Status: She is alert.  Psychiatric:        Mood and Affect: Mood normal.        Behavior: Behavior normal.    UC Treatments / Results  Labs (all labs ordered are listed, but only abnormal results are displayed) Labs Reviewed  URINALYSIS, COMPLETE (UACMP) WITH MICROSCOPIC - Abnormal; Notable for the following components:      Result Value   Bacteria, UA MANY (*)    All other components within normal limits  URINE CULTURE    EKG   Radiology No results found.  Procedures Procedures (including critical care time)  Medications Ordered in UC Medications - No data to display  Initial Impression / Assessment and Plan / UC Course  I have reviewed the triage  vital signs and the nursing notes.  Pertinent labs & imaging results that were available during my care of the patient were reviewed by me and considered in my medical decision making (see chart for details).    55 year old female presents with left flank pain and left inguinal pain.  She has had some urinary frequency as well.  Urinalysis is consistent with UTI.  Sending culture.  Patient also appears to have a musculoskeletal component to her pain.  Diclofenac as directed.  Placing on Bactrim for UTI.  Final Clinical Impressions(s) / UC Diagnoses   Final diagnoses:  Left flank pain  Left inguinal pain  Urinary frequency     Discharge Instructions     Rest.  Lots of fluids.  Medication as prescribed.  Take care  Dr. Lacinda Axon    ED Prescriptions    Medication Sig Dispense Auth. Provider   sulfamethoxazole-trimethoprim (BACTRIM DS) 800-160 MG tablet Take 1 tablet by mouth 2 (two) times daily for 7 days. 14 tablet Syanna Remmert G, DO   diclofenac (VOLTAREN) 75 MG EC tablet Take 1 tablet (75 mg total) by mouth 2 (two) times daily as needed for moderate pain. 14 tablet Coral Spikes, DO     PDMP not reviewed this encounter.   Coral Spikes, [place] 06/17/20 1337

## 2020-06-18 LAB — URINE CULTURE: Culture: NO GROWTH

## 2020-07-29 ENCOUNTER — Ambulatory Visit: Payer: 59 | Admitting: Orthopaedic Surgery

## 2020-07-29 ENCOUNTER — Ambulatory Visit (INDEPENDENT_AMBULATORY_CARE_PROVIDER_SITE_OTHER): Payer: 59

## 2020-07-29 DIAGNOSIS — M25561 Pain in right knee: Secondary | ICD-10-CM

## 2020-07-29 DIAGNOSIS — M24661 Ankylosis, right knee: Secondary | ICD-10-CM

## 2020-07-29 DIAGNOSIS — Z96651 Presence of right artificial knee joint: Secondary | ICD-10-CM

## 2020-07-29 NOTE — Progress Notes (Signed)
Office Visit Note   Patient: Mindy Mack           Date of Birth: [birth year]           MRN: [id number] Visit Date: 07/29/2020              Requested by: [place],  [place] [address] PCP: [place]   Assessment & Plan: Visit Diagnoses:  1. Hx of total knee replacement, right   2. Acute pain of right knee   3. Arthrofibrosis of knee joint, right     Plan: Impression is 7 years status post right total knee replacement with increased pain and decreased range of motion.  May have synovitis limiting joint ROM.  At this point, we have recommended a triple phase bone scan to rule out loosening or chronic infection.  She will follow-up with [place] once that has been completed.  Follow-Up Instructions: Return for after bone scan.   Orders:  Orders Placed This Encounter  Procedures  . XR KNEE 3 VIEW RIGHT   No orders of the defined types were placed in this encounter.     Procedures: No procedures performed   Clinical Data: No additional findings.   Subjective: Chief Complaint  Patient presents with  . Right Knee - Pain    HPI patient is a very pleasant 55 year old female who comes in today for evaluation of right knee pain and stiffness.  She is status post right total knee replacement by Dr. Rozelle Logan in [place] back in 2015.  She was initially doing okay with pain and range of motion but has noted over the past 2 years that her pain has increased and range of motion has decreased.  The pain she has is primarily to the lateral aspect.  She denies any anterior thigh or groin pain.  She is unable to get comfortable in her bed.  Pain seems to be worse with stairs and walking.  She has been taking Aleve without significant relief.  Review of Systems as detailed in HPI.  All others reviewed and are negative.   Objective: Vital Signs: There were no vitals taken for this visit.  Physical Exam well-developed and  well-nourished female in no acute distress.  Alert and oriented x3.  Ortho Exam right knee exam shows a fully healed surgical scar without complication.  Range of motion 0 to 80 degrees.  She is stable to valgus varus stress.  She does have slight lateral joint line tenderness.  4 out of 5 strength with resisted straight leg raise.  She is neurovascular intact distally.  Specialty Comments:  No specialty comments available.  Imaging: XR KNEE 3 VIEW RIGHT  Result Date: 07/29/2020 X-rays demonstrate a well-seated prosthesis without complication    PMFS History: There are no problems to display for this patient.  No past medical history on file.  Family History  Problem Relation Age of Onset  . Hypertension Mother   . Dementia Father   . Parkinson's disease Father     Past Surgical History:  Procedure Laterality Date  . APPENDECTOMY    . CESAREAN SECTION    . JOINT REPLACEMENT     Social History   Occupational History  . Not on file  Tobacco Use  . Smoking status: Never Smoker  . Smokeless tobacco: Never Used  Vaping Use  . Vaping Use: Never used  Substance and Sexual Activity  . Alcohol use: Yes  Comment: occasionally  . Drug use: No  . Sexual activity: Not on file

## 2020-08-08 ENCOUNTER — Other Ambulatory Visit: Payer: Self-pay

## 2020-08-08 ENCOUNTER — Encounter (HOSPITAL_COMMUNITY)
Admission: RE | Admit: 2020-08-08 | Discharge: 2020-08-08 | Disposition: A | Discharge status: Home / Self Care | Payer: 59 | Source: Ambulatory Visit | Attending: Orthopaedic Surgery | Admitting: Orthopaedic Surgery

## 2020-08-08 ENCOUNTER — Ambulatory Visit (HOSPITAL_COMMUNITY)
Admission: RE | Admit: 2020-08-08 | Discharge: 2020-08-08 | Disposition: A | Discharge status: Home / Self Care | Payer: 59 | Source: Ambulatory Visit | Attending: Orthopaedic Surgery | Admitting: Orthopaedic Surgery

## 2020-08-08 DIAGNOSIS — Z96651 Presence of right artificial knee joint: Secondary | ICD-10-CM | POA: Diagnosis present

## 2020-08-08 DIAGNOSIS — M25561 Pain in right knee: Secondary | ICD-10-CM | POA: Diagnosis not present

## 2020-08-08 MED ORDER — TECHNETIUM TC 99M MEDRONATE IV KIT
20.0000 | PACK | Freq: Once | INTRAVENOUS | Status: AC | PRN
  Administered 2020-08-08: 20 via INTRAVENOUS

## 2020-08-12 ENCOUNTER — Ambulatory Visit (HOSPITAL_COMMUNITY): Payer: 59

## 2020-08-12 ENCOUNTER — Other Ambulatory Visit (HOSPITAL_COMMUNITY): Payer: 59

## 2020-08-13 ENCOUNTER — Encounter: Payer: Self-pay | Admitting: Orthopaedic Surgery

## 2020-08-13 ENCOUNTER — Ambulatory Visit: Payer: 59 | Admitting: Orthopaedic Surgery

## 2020-08-13 DIAGNOSIS — Z96651 Presence of right artificial knee joint: Secondary | ICD-10-CM

## 2020-08-13 NOTE — Progress Notes (Signed)
   Office Visit Note   Patient: Mindy Mack           Date of Birth: Nov 23, 1965           MRN: [id number] Visit Date: 08/13/2020              Requested by: [place],  [place] [address] PCP: [place]   Assessment & Plan: Visit Diagnoses:  1. Hx of total knee replacement, right     Plan: Bone scan is concerning for loosening more likely aseptic.  However I would like to get screening labs to evaluate for possible infection.  I will follow-up with the patient with the results.  We also had a discussion about living with the pain and symptoms for now versus revision surgery.  Overall sounds like her symptoms have progressively worsened over the last year.  Follow-Up Instructions: Return if symptoms worsen or fail to improve.   Orders:  No orders of the defined types were placed in this encounter.  No orders of the defined types were placed in this encounter.     Procedures: No procedures performed   Clinical Data: No additional findings.   Subjective: Chief Complaint  Patient presents with  . Right Knee - Pain    [place] returns today for review of recent bone scan of the right knee.   Review of Systems   Objective: Vital Signs: There were no vitals taken for this visit.  Physical Exam  Ortho Exam Right knee exam is unchanged. Specialty Comments:  No specialty comments available.  Imaging: No results found.   PMFS History: There are no problems to display for this patient.  History reviewed. No pertinent past medical history.  Family History  Problem Relation Age of Onset  . Hypertension Mother   . Dementia Father   . Parkinson's disease Father     Past Surgical History:  Procedure Laterality Date  . APPENDECTOMY    . CESAREAN SECTION    . JOINT REPLACEMENT     Social History   Occupational History  . Not on file  Tobacco Use  . Smoking status: Never Smoker  . Smokeless tobacco: Never  Used  Vaping Use  . Vaping Use: Never used  Substance and Sexual Activity  . Alcohol use: Yes    Comment: occasionally  . Drug use: No  . Sexual activity: Not on file

## 2020-08-13 NOTE — Addendum Note (Signed)
Addended by: Precious Bard on: 08/13/2020 01:53 PM   Modules accepted: Orders

## 2020-08-14 LAB — CBC WITH DIFFERENTIAL/PLATELET
Absolute Monocytes: 291 cells/uL (ref 200–950)
Basophils Absolute: 21 cells/uL (ref 0–200)
Basophils Relative: 0.5 %
Eosinophils Absolute: 41 cells/uL (ref 15–500)
Eosinophils Relative: 1 %
HCT: 42.7 % (ref 35.0–45.0)
Hemoglobin: 13.9 g/dL (ref 11.7–15.5)
Lymphs Abs: 1640 cells/uL (ref 850–3900)
MCH: 29.1 pg (ref 27.0–33.0)
MCHC: 32.6 g/dL (ref 32.0–36.0)
MCV: 89.5 fL (ref 80.0–100.0)
MPV: 11.2 fL (ref 7.5–12.5)
Monocytes Relative: 7.1 %
Neutro Abs: 2107 cells/uL (ref 1500–7800)
Neutrophils Relative %: 51.4 %
Platelets: 235 10*3/uL (ref 140–400)
RBC: 4.77 10*6/uL (ref 3.80–5.10)
RDW: 12.3 % (ref 11.0–15.0)
Total Lymphocyte: 40 %
WBC: 4.1 10*3/uL (ref 3.8–10.8)

## 2020-08-14 LAB — C-REACTIVE PROTEIN: CRP: 10.3 mg/L — ABNORMAL HIGH (ref <8.0)

## 2020-08-14 LAB — SEDIMENTATION RATE: Sed Rate: 29 mm/h (ref 0–30)

## 2020-08-20 ENCOUNTER — Encounter: Payer: Self-pay | Admitting: Orthopaedic Surgery

## 2020-08-20 ENCOUNTER — Ambulatory Visit: Payer: 59 | Admitting: Orthopaedic Surgery

## 2020-08-20 ENCOUNTER — Other Ambulatory Visit: Payer: Self-pay

## 2020-08-20 DIAGNOSIS — Z96651 Presence of right artificial knee joint: Secondary | ICD-10-CM | POA: Diagnosis not present

## 2020-08-20 DIAGNOSIS — T8484XD Pain due to internal orthopedic prosthetic devices, implants and grafts, subsequent encounter: Secondary | ICD-10-CM | POA: Diagnosis not present

## 2020-08-20 MED ORDER — LIDOCAINE HCL 1 % IJ SOLN
2.0000 mL | INTRAMUSCULAR | Status: AC | PRN
  Administered 2020-08-20: 2 mL

## 2020-08-20 MED ORDER — BUPIVACAINE HCL 0.5 % IJ SOLN
2.0000 mL | INTRAMUSCULAR | Status: AC | PRN
  Administered 2020-08-20: 2 mL via INTRA_ARTICULAR

## 2020-08-20 NOTE — Progress Notes (Signed)
   Office Visit Note   Patient: Mindy Mack           Date of Birth: Jul 14, 1965           MRN: [id number] Visit Date: 08/20/2020              Requested by: [place],  [place] [address] PCP: [place]   Assessment & Plan: Visit Diagnoses:  1. Pain due to total right knee replacement, subsequent encounter     Plan: Under sterile conditions I aspirated approximately 3 cc of clear joint fluid.  We will send this to the lab for cell count and cultures.  Bone scan is concerning for aseptic versus septic loosening.  CRP was elevated.  We will be in touch with the patient regarding the fluid results.  Follow-Up Instructions: No follow-ups on file.   Orders:  No orders of the defined types were placed in this encounter.  No orders of the defined types were placed in this encounter.     Procedures: Large Joint Inj: R knee on 08/20/2020 10:10 AM Indications: pain Details: 22 G needle  Arthrogram: No  Medications: 2 mL lidocaine 1 %; 2 mL bupivacaine 0.5 % Aspirate: 3 mL clear; sent for lab analysis Outcome: tolerated well, no immediate complications Consent was given by the patient. Patient was prepped and draped in the usual sterile fashion.      Clinical Data: No additional findings.   Subjective: Chief Complaint  Patient presents with   Right Knee - Pain    [place] returns today for review of recent lab work and bone scan as well as aspiration of the right knee joint.  Denies any changes.   Review of Systems   Objective: Vital Signs: There were no vitals taken for this visit.  Physical Exam  Ortho Exam Right knee exam is unchanged. Specialty Comments:  No specialty comments available.  Imaging: No results found.   PMFS History: There are no problems to display for this patient.  History reviewed. No pertinent past medical history.  Family History  Problem Relation Age of Onset   Hypertension  Mother    Dementia Father    Parkinson's disease Father     Past Surgical History:  Procedure Laterality Date   APPENDECTOMY     CESAREAN SECTION     JOINT REPLACEMENT     Social History   Occupational History   Not on file  Tobacco Use   Smoking status: Never   Smokeless tobacco: Never  Vaping Use   Vaping Use: Never used  Substance and Sexual Activity   Alcohol use: Yes    Comment: occasionally   Drug use: No   Sexual activity: Not on file

## 2020-08-20 NOTE — Addendum Note (Signed)
Addended by: Precious Bard on: 08/20/2020 11:32 AM   Modules accepted: Orders

## 2020-08-22 ENCOUNTER — Encounter: Payer: Self-pay | Admitting: Orthopaedic Surgery

## 2020-08-25 ENCOUNTER — Telehealth: Payer: Self-pay | Admitting: Orthopaedic Surgery

## 2020-08-25 ENCOUNTER — Other Ambulatory Visit: Payer: Self-pay | Admitting: Orthopaedic Surgery

## 2020-08-25 ENCOUNTER — Encounter: Payer: Self-pay | Admitting: Orthopaedic Surgery

## 2020-08-25 MED ORDER — TRAMADOL HCL 50 MG PO TABS: 50.0000 mg | ORAL_TABLET | Freq: Every day | ORAL | 0 refills | Status: DC | PRN

## 2020-08-25 NOTE — Telephone Encounter (Signed)
error 

## 2020-08-25 NOTE — Telephone Encounter (Signed)
Yes that's totally fine.

## 2020-08-25 NOTE — Telephone Encounter (Signed)
Yes that's fine.  As long as she needs

## 2020-08-25 NOTE — Telephone Encounter (Signed)
Ask her how long she wants to be [place]? Thanks.

## 2020-08-26 ENCOUNTER — Telehealth: Payer: Self-pay | Admitting: Orthopaedic Surgery

## 2020-08-26 ENCOUNTER — Encounter: Payer: Self-pay | Admitting: Orthopaedic Surgery

## 2020-08-26 LAB — ANAEROBIC AND AEROBIC CULTURE
AER RESULT:: NO GROWTH
MICRO NUMBER:: 12010908
MICRO NUMBER:: 12010909
SPECIMEN QUALITY:: ADEQUATE
SPECIMEN QUALITY:: ADEQUATE

## 2020-08-26 LAB — SYNOVIAL FLUID ANALYSIS, COMPLETE
Basophils, %: 0 %
Eosinophils-Synovial: 0 % (ref 0–2)
Lymphocytes-Synovial Fld: 17 % (ref 0–74)
Monocyte/Macrophage: 44 % (ref 0–69)
Neutrophil, Synovial: 39 % — ABNORMAL HIGH (ref 0–24)
Synoviocytes, %: 0 % (ref 0–15)
WBC, Synovial: 90 cells/uL (ref <150)

## 2020-08-26 NOTE — Telephone Encounter (Signed)
Emailed

## 2020-08-26 NOTE — Telephone Encounter (Signed)
Pt called stating she is having issues finding the letter in her mychart and was not able to find it after searching. Pt asked if the letter can be sent to her email [email or web address]

## 2020-08-26 NOTE — Telephone Encounter (Signed)
The best call back number if needed is [phone]

## 2020-08-26 NOTE — Telephone Encounter (Signed)
Records including labs faxed to Dr. Mayer Camel per referral by Dr. Erlinda Hong. Fax [phone]

## 2020-08-28 ENCOUNTER — Encounter: Payer: Self-pay | Admitting: Orthopaedic Surgery

## 2020-08-28 NOTE — Telephone Encounter (Signed)
Please obtain records from her recent visit with [facility].  Thanks.

## 2020-08-29 ENCOUNTER — Telehealth: Payer: Self-pay

## 2020-08-29 NOTE — Telephone Encounter (Signed)
Called Dr Shaune Spittle office [facility] [phone]) to obtain OV Note from Dr. Mayer Camel.   They will fax to [place].

## 2020-08-29 NOTE — Telephone Encounter (Signed)
I will obtain records

## 2021-01-08 ENCOUNTER — Encounter: Payer: Self-pay | Admitting: *Deleted

## 2021-01-09 ENCOUNTER — Ambulatory Visit: Payer: 59 | Admitting: Anesthesiology

## 2021-01-09 ENCOUNTER — Encounter: Payer: Self-pay | Admitting: *Deleted

## 2021-01-09 ENCOUNTER — Ambulatory Visit
Admission: RE | Admit: 2021-01-09 | Discharge: 2021-01-09 | Disposition: A | Discharge status: Home / Self Care | Payer: 59 | Attending: Gastroenterology | Admitting: Gastroenterology

## 2021-01-09 ENCOUNTER — Encounter
Admission: RE | Disposition: A | Discharge status: Home / Self Care | Payer: Self-pay | Source: Home / Self Care | Attending: Gastroenterology

## 2021-01-09 ENCOUNTER — Other Ambulatory Visit: Payer: Self-pay

## 2021-01-09 DIAGNOSIS — Z1211 Encounter for screening for malignant neoplasm of colon: Secondary | ICD-10-CM | POA: Diagnosis not present

## 2021-01-09 DIAGNOSIS — K5730 Diverticulosis of large intestine without perforation or abscess without bleeding: Secondary | ICD-10-CM | POA: Diagnosis not present

## 2021-01-09 DIAGNOSIS — E669 Obesity, unspecified: Secondary | ICD-10-CM | POA: Insufficient documentation

## 2021-01-09 DIAGNOSIS — Z6841 Body Mass Index (BMI) 40.0 and over, adult: Secondary | ICD-10-CM | POA: Insufficient documentation

## 2021-01-09 DIAGNOSIS — D123 Benign neoplasm of transverse colon: Secondary | ICD-10-CM | POA: Insufficient documentation

## 2021-01-09 DIAGNOSIS — K640 First degree hemorrhoids: Secondary | ICD-10-CM | POA: Insufficient documentation

## 2021-01-09 HISTORY — PX: COLONOSCOPY WITH PROPOFOL: SHX5780

## 2021-01-09 SURGERY — COLONOSCOPY WITH PROPOFOL
Anesthesia: General

## 2021-01-09 MED ORDER — PROPOFOL 10 MG/ML IV BOLUS
INTRAVENOUS | Status: DC | PRN
  Administered 2021-01-09 (×2): 50 mg via INTRAVENOUS
  Administered 2021-01-09: 100 mg via INTRAVENOUS

## 2021-01-09 MED ORDER — PROPOFOL 500 MG/50ML IV EMUL
INTRAVENOUS | Status: DC | PRN
  Administered 2021-01-09: 150 ug/kg/min via INTRAVENOUS

## 2021-01-09 MED ORDER — LIDOCAINE 2% (20 MG/ML) 5 ML SYRINGE
INTRAMUSCULAR | Status: DC | PRN
  Administered 2021-01-09: 50 mg via INTRAVENOUS

## 2021-01-09 MED ORDER — PROPOFOL 500 MG/50ML IV EMUL
INTRAVENOUS | Status: AC
  Filled 2021-01-09: qty 50

## 2021-01-09 MED ORDER — SODIUM CHLORIDE 0.9 % IV SOLN: INTRAVENOUS | Status: DC

## 2021-01-09 NOTE — H&P (Signed)
Outpatient short stay form Pre-procedure 01/09/2021  Mindy Rubenstein, MD  Primary Physician: [place]  Reason for visit:  Screening  History of present illness:   55 y/o lady with history of obesity here for screening colonoscopy. No blood thinners. No family history of GI malignancies. History of appendectomy and two c-sections.    Current Facility-Administered Medications:    0.9 %  sodium chloride infusion, , Intravenous, Continuous, Mohamadou Maciver, Hilton Cork, MD, Last Rate: 20 mL/hr at 01/09/21 0756, Continued from Pre-op at 01/09/21 0756  Medications Prior to Admission  Medication Sig Dispense Refill Last Dose   diclofenac (VOLTAREN) 75 MG EC tablet Take 1 tablet (75 mg total) by mouth 2 (two) times daily as needed for moderate pain. (Patient not taking: Reported on 01/09/2021) 14 tablet 0 Not Taking   traMADol (ULTRAM) 50 MG tablet Take 1-2 tablets (50-100 mg total) by mouth daily as needed. (Patient not taking: Reported on 01/09/2021) 20 tablet 0 Not Taking     No Known Allergies   History reviewed. No pertinent past medical history.  Review of systems:  Otherwise negative.    Physical Exam  Gen: Alert, oriented. Appears stated age.  HEENT: PERRLA. Lungs: No respiratory distress CV: RRR Abd: soft, benign, no masses Ext: No edema    Planned procedures: Proceed with colonoscopy. The patient understands the nature of the planned procedure, indications, risks, alternatives and potential complications including but not limited to bleeding, infection, perforation, damage to internal organs and possible oversedation/side effects from anesthesia. The patient agrees and gives consent to proceed.  Please refer to procedure notes for findings, recommendations and patient disposition/instructions.     Mindy Rubenstein, MD [facility]

## 2021-01-09 NOTE — Op Note (Signed)
[facility] Patient Name: Mindy Mack Procedure Date: 01/09/2021 8:17 AM MRN: [id number] Account #: [id number] Date of Birth: 1966-01-20 Admit Type: Outpatient Age: 55 Room: [facility] ENDO ROOM 3 Gender: Female Note Status: Finalized Instrument Name: Park Meo [id number] Procedure:             Colonoscopy Indications:           Screening for colorectal malignant neoplasm Providers:             Andrey Farmer MD, MD Medicines:             Monitored Anesthesia Care Complications:         No immediate complications. Estimated blood loss:                         Minimal. Procedure:             Pre-Anesthesia Assessment:                        - Prior to the procedure, a History and Physical was                         performed, and patient medications and allergies were                         reviewed. The patient is competent. The risks and                         benefits of the procedure and the sedation options and                         risks were discussed with the patient. All questions                         were answered and informed consent was obtained.                         Patient identification and proposed procedure were                         verified by the physician, the nurse, the anesthetist                         and the technician in the endoscopy suite. Mental                         Status Examination: alert and oriented. Airway                         Examination: normal oropharyngeal airway and neck                         mobility. Respiratory Examination: clear to                         auscultation. CV Examination: normal. Prophylactic                         Antibiotics: The patient does not require prophylactic  antibiotics. Prior Anticoagulants: The patient has                         taken no previous anticoagulant or antiplatelet                         agents. ASA Grade Assessment:  III - A patient with                         severe systemic disease. After reviewing the risks and                         benefits, the patient was deemed in satisfactory                         condition to undergo the procedure. The anesthesia                         plan was to use monitored anesthesia care (MAC).                         Immediately prior to administration of medications,                         the patient was re-assessed for adequacy to receive                         sedatives. The heart rate, respiratory rate, oxygen                         saturations, blood pressure, adequacy of pulmonary                         ventilation, and response to care were monitored                         throughout the procedure. The physical status of the                         patient was re-assessed after the procedure.                        After obtaining informed consent, the colonoscope was                         passed under direct vision. Throughout the procedure,                         the patient's blood pressure, pulse, and oxygen                         saturations were monitored continuously. The                         Colonoscope was introduced through the anus and                         advanced to the the cecum, identified by appendiceal  orifice and ileocecal valve. The colonoscopy was                         somewhat difficult due to significant looping.                         Successful completion of the procedure was aided by                         applying abdominal pressure. The patient tolerated the                         procedure well. The quality of the bowel preparation                         was good. Findings:      The perianal and digital rectal examinations were normal.      A 1 mm polyp was found in the transverse colon. The polyp was sessile.       The polyp was removed with a jumbo cold forceps. Resection and  retrieval       were complete. Estimated blood loss was minimal.      A few small-mouthed diverticula were found in the sigmoid colon,       descending colon and transverse colon.      Internal hemorrhoids were found during retroflexion. The hemorrhoids       were Grade I (internal hemorrhoids that do not prolapse).      The exam was otherwise without abnormality on direct and retroflexion       views. Impression:            - One 1 mm polyp in the transverse colon, removed with                         a jumbo cold forceps. Resected and retrieved.                        - Diverticulosis in the sigmoid colon, in the                         descending colon and in the transverse colon.                        - Internal hemorrhoids.                        - The examination was otherwise normal on direct and                         retroflexion views. Recommendation:        - Discharge patient to home.                        - Resume previous diet.                        - Continue present medications.                        - Await pathology results.                        -  Repeat colonoscopy for surveillance based on                         pathology results.                        - Return to referring physician as previously                         scheduled. Procedure Code(s):     --- Professional ---                        [phone], Colonoscopy, flexible; with biopsy, single or                         multiple Diagnosis Code(s):     --- Professional ---                        Z12.11, Encounter for screening for malignant neoplasm                         of colon                        K63.5, Polyp of colon                        K64.0, First degree hemorrhoids                        K57.30, Diverticulosis of large intestine without                         perforation or abscess without bleeding CPT copyright 2019 American Medical Association. All rights reserved. The codes  documented in this report are preliminary and upon coder review may  be revised to meet current compliance requirements. Andrey Farmer MD, MD 01/09/2021 8:54:36 AM Number of Addenda: 0 Note Initiated On: 01/09/2021 8:17 AM Scope Withdrawal Time: 0 hours 10 minutes 43 seconds  Total Procedure Duration: 0 hours 19 minutes 0 seconds  Estimated Blood Loss:  Estimated blood loss was minimal.      [facility]

## 2021-01-09 NOTE — Anesthesia Postprocedure Evaluation (Signed)
Anesthesia Post Note  Patient: Mindy Mack  Procedure(s) Performed: COLONOSCOPY WITH PROPOFOL  Patient location during evaluation: PACU Anesthesia Type: General Level of consciousness: awake and alert Pain management: pain level controlled Vital Signs Assessment: post-procedure vital signs reviewed and stable Respiratory status: spontaneous breathing, nonlabored ventilation and respiratory function stable Cardiovascular status: blood pressure returned to baseline and stable Postop Assessment: no apparent nausea or vomiting Anesthetic complications: no   No notable events documented.   Last Vitals:  Vitals:   01/09/21 0905 01/09/21 0915  BP: 113/73 127/79  Pulse: (!) 58 62  Resp: 14 13  Temp: (!) 35.4 C   SpO2: 100% 100%    Last Pain:  Vitals:   01/09/21 0915  TempSrc:   PainSc: 0-No pain                 Brett Canales Crawford Tamura

## 2021-01-09 NOTE — Transfer of Care (Signed)
Immediate Anesthesia Transfer of Care Note  Patient: Mindy Mack  Procedure(s) Performed: COLONOSCOPY WITH PROPOFOL  Patient Location: PACU  Anesthesia Type:General  Level of Consciousness: sedated  Airway & Oxygen Therapy: Patient Spontanous Breathing  Post-op Assessment: Report given to RN and Post -op Vital signs reviewed and stable  Post vital signs: Reviewed and stable  Last Vitals:  Vitals Value Taken Time  BP 107/66 01/09/21 0855  Temp    Pulse 65 01/09/21 0857  Resp 17 01/09/21 0857  SpO2 98 % 01/09/21 0857  Vitals shown include unvalidated device data.  Last Pain:  Vitals:   01/09/21 0855  TempSrc:   PainSc: 0-No pain         Complications: No notable events documented.

## 2021-01-09 NOTE — Anesthesia Preprocedure Evaluation (Addendum)
Anesthesia Evaluation  Patient identified by MRN, date of birth, ID band Patient awake    Reviewed: Allergy & Precautions, H&P , NPO status , Patient's Chart, lab work & pertinent test results  History of Anesthesia Complications Negative for: history of anesthetic complications  Airway Mallampati: II  TM Distance: >3 FB Neck ROM: full    Dental no notable dental hx.    Pulmonary neg pulmonary ROS, neg sleep apnea, neg COPD,    Pulmonary exam normal        Cardiovascular (-) angina(-) Past MI and (-) Cardiac Stents negative cardio ROS Normal cardiovascular exam(-) dysrhythmias      Neuro/Psych negative neurological ROS  negative psych ROS   GI/Hepatic negative GI ROS, Neg liver ROS,   Endo/Other  Morbid obesity  Renal/GU negative Renal ROS  negative genitourinary   Musculoskeletal   Abdominal   Peds  Hematology negative hematology ROS (+)   Anesthesia Other Findings History reviewed. No pertinent past medical history.  Past Surgical History: No date: APPENDECTOMY No date: BACK SURGERY No date: CESAREAN SECTION No date: JOINT REPLACEMENT  BMI    Body Mass Index: 48.76 kg/m      Reproductive/Obstetrics negative OB ROS                             Anesthesia Physical Anesthesia Plan  ASA: 3  Anesthesia Plan: General   Post-op Pain Management:    Induction:   PONV Risk Score and Plan: Propofol infusion and TIVA  Airway Management Planned: Nasal Cannula  Additional Equipment:   Intra-op Plan:   Post-operative Plan:   Informed Consent: I have reviewed the patients History and Physical, chart, labs and discussed the procedure including the risks, benefits and alternatives for the proposed anesthesia with the patient or authorized representative who has indicated his/her understanding and acceptance.     Dental Advisory Given  Plan Discussed with: Anesthesiologist,  CRNA and Surgeon  Anesthesia Plan Comments: (IVGA native airway)       Anesthesia Quick Evaluation

## 2021-01-09 NOTE — Interval H&P Note (Signed)
History and Physical Interval Note:  01/09/2021 8:25 AM  Mindy Mack  has presented today for surgery, with the diagnosis of colon screening.  The various methods of treatment have been discussed with the patient and family. After consideration of risks, benefits and other options for treatment, the patient has consented to  Procedure(s): COLONOSCOPY WITH PROPOFOL (N/A) as a surgical intervention.  The patient's history has been reviewed, patient examined, no change in status, stable for surgery.  I have reviewed the patient's chart and labs.  Questions were answered to the patient's satisfaction.     Lesly Rubenstein  Ok to proceed with colonoscopy

## 2021-01-12 ENCOUNTER — Encounter: Payer: Self-pay | Admitting: Gastroenterology

## 2021-01-12 LAB — SURGICAL PATHOLOGY

## 2021-01-30 ENCOUNTER — Other Ambulatory Visit: Payer: Self-pay

## 2021-01-30 ENCOUNTER — Ambulatory Visit
Admission: EM | Admit: 2021-01-30 | Discharge: 2021-01-30 | Disposition: A | Discharge status: Home / Self Care | Payer: 59 | Attending: Internal Medicine | Admitting: Internal Medicine

## 2021-01-30 ENCOUNTER — Encounter: Payer: Self-pay | Admitting: Licensed Clinical Social Worker

## 2021-01-30 DIAGNOSIS — L84 Corns and callosities: Secondary | ICD-10-CM

## 2021-01-30 DIAGNOSIS — M722 Plantar fascial fibromatosis: Secondary | ICD-10-CM | POA: Diagnosis not present

## 2021-01-30 MED ORDER — DICLOFENAC SODIUM 75 MG PO TBEC: 75.0000 mg | DELAYED_RELEASE_TABLET | Freq: Two times a day (BID) | ORAL | 0 refills | Status: DC

## 2021-01-30 NOTE — Discharge Instructions (Signed)
Scrub the skin where the callus is and if you see a cream or  yellow color circle under, then you have a plantar wart and you may use the over the counter wart treatments  Avoid walking with no shoes Do the foot stretches every time you have not been walking for  more than 10 minutes.

## 2021-01-30 NOTE — ED Provider Notes (Signed)
[facility]    CSN: [id number] Arrival date & time: 01/30/21  0815      History   Chief Complaint Chief Complaint  Patient presents with   Foot Pain    HPI Mindy Mack is a 55 y.o. female who presents due to having R sole of feet pain which is similar when she had plantar fasciitis pain flairs off and on x 1 month. She does not wear shoes when at home and works long hours standing, but does wear tennis shoes with insole since last time she was here.   She also has had a callus area on lateral area of her L foot, hurts at times.     History reviewed. No pertinent past medical history.  There are no problems to display for this patient.   Past Surgical History:  Procedure Laterality Date   APPENDECTOMY     BACK SURGERY     CESAREAN SECTION     COLONOSCOPY WITH PROPOFOL N/A 01/09/2021   Procedure: COLONOSCOPY WITH PROPOFOL;  Surgeon: Lesly Rubenstein, MD;  Location: [facility] ENDOSCOPY;  Service: Endoscopy;  Laterality: N/A;   JOINT REPLACEMENT      OB History   No obstetric history on file.      Home Medications    Prior to Admission medications   Medication Sig Start Date End Date Taking? Authorizing Provider  diclofenac (VOLTAREN) 75 MG EC tablet Take 1 tablet (75 mg total) by mouth 2 (two) times daily. 01/30/21   Rodriguez-Southworth, Sunday Spillers, PA-C    Family History Family History  Problem Relation Age of Onset   Hypertension Mother    Dementia Father    Parkinson's disease Father     Social History Social History   Tobacco Use   Smoking status: Never   Smokeless tobacco: Never  Vaping Use   Vaping Use: Never used  Substance Use Topics   Alcohol use: Yes    Comment: occasionally   Drug use: No     Allergies   Patient has no known allergies.   Review of Systems Review of Systems   Physical Exam Triage Vital Signs ED Triage Vitals  Enc Vitals Group     BP 01/30/21 0843 116/69     Pulse Rate 01/30/21 0843 (!) 58      Resp 01/30/21 0843 16     Temp 01/30/21 0843 98.2 F (36.8 C)     Temp Source 01/30/21 0843 Oral     SpO2 01/30/21 0843 98 %     Weight 01/30/21 0841 292 lb 15.9 oz (132.9 kg)     Height 01/30/21 0841 5\' 5"  (1.651 m)     Head Circumference --      Peak Flow --      Pain Score 01/30/21 0841 8     Pain Loc --      Pain Edu? --      Excl. in [place]? --    No data found.  Updated Vital Signs BP 116/69 (BP Location: Left Arm)   Pulse (!) 58   Temp 98.2 F (36.8 C) (Oral)   Resp 16   Ht 5\' 5"  (1.651 m)   Wt 292 lb 15.9 oz (132.9 kg)   LMP 10/29/2019 (Approximate)   SpO2 98%   BMI 48.76 kg/m   Visual Acuity Right Eye Distance:   Left Eye Distance:   Bilateral Distance:    Right Eye Near:   Left Eye Near:    Bilateral Near:  Physical Exam Vitals and nursing note reviewed.  Constitutional:      General: She is not in acute distress.    Appearance: She is obese. She is not toxic-appearing.  HENT:     Right Ear: External ear normal.     Left Ear: External ear normal.  Eyes:     General: No scleral icterus.    Conjunctiva/sclera: Conjunctivae normal.  Cardiovascular:     Pulses: Normal pulses.  Pulmonary:     Effort: Pulmonary effort is normal.  Musculoskeletal:        General: Normal range of motion.     Cervical back: Neck supple.  Skin:    General: Skin is warm and dry.     Findings: No rash.     Comments: 1/2 cm x 1/3 cm callus on lateral L foot, I do not see a wart, but could be deeper  R FOOT- tender on the fascia closer to her heel, has normal ROM, no wounds noted.   Neurological:     Mental Status: She is alert and oriented to person, place, and time.     Gait: Gait normal.  Psychiatric:        Mood and Affect: Mood normal.        Behavior: Behavior normal.        Thought Content: Thought content normal.        Judgment: Judgment normal.     UC Treatments / Results  Labs (all labs ordered are listed, but only abnormal results are displayed) Labs  Reviewed - No data to display  EKG   Radiology No results found.  Procedures Procedures (including critical care time)  Medications Ordered in UC Medications - No data to display  Initial Impression / Assessment and Plan / UC Course  I have reviewed the triage vital signs and the nursing notes. R plantar fasciitis and callus L foot I placed her on Diclofenac.  Advised to not go without shoes to prevent worsening or recurrence. Needs to do plantar stretches daily. See instructions.  Final Clinical Impressions(s) / UC Diagnoses   Final diagnoses:  Plantar fasciitis of right foot  Foot callus     Discharge Instructions      Scrub the skin where the callus is and if you see a cream or  yellow color circle under, then you have a plantar wart and you may use the over the counter wart treatments  Avoid walking with no shoes Do the foot stretches every time you have not been walking for  more than 10 minutes.      ED Prescriptions     Medication Sig Dispense Auth. Provider   diclofenac (VOLTAREN) 75 MG EC tablet Take 1 tablet (75 mg total) by mouth 2 (two) times daily. 14 tablet Rodriguez-Southworth, Sunday Spillers, PA-C      I have reviewed the PDMP during this encounter.   Shelby Mattocks, PA-C 01/30/21 1549

## 2021-01-30 NOTE — ED Triage Notes (Signed)
Pt. C/o plantar fascitis pain in the right foot and left pain on the side. Sxs x 1 month for both.

## 2021-02-03 ENCOUNTER — Ambulatory Visit (INDEPENDENT_AMBULATORY_CARE_PROVIDER_SITE_OTHER): Payer: 59

## 2021-02-03 ENCOUNTER — Other Ambulatory Visit: Payer: Self-pay

## 2021-02-03 ENCOUNTER — Encounter: Payer: Self-pay | Admitting: *Deleted

## 2021-02-03 ENCOUNTER — Ambulatory Visit: Payer: 59 | Admitting: Podiatry

## 2021-02-03 DIAGNOSIS — M722 Plantar fascial fibromatosis: Secondary | ICD-10-CM

## 2021-02-05 ENCOUNTER — Encounter: Payer: Self-pay | Admitting: Podiatry

## 2021-02-05 NOTE — Progress Notes (Signed)
  Subjective:  Patient ID: Mindy Mack, female    DOB: [birth year],  MRN: [id number]  No chief complaint on file.   55 y.o. female presents with the above complaint.  Patient presents with complaint of right heel pain.  Patient states been going for quite some time is been going for few months.  Hurts with ambulation.  She has been diagnosed with Planter fasciitis in the past.  She was given diclofenac 75 mg which helped a little bit.  She wanted get it evaluated and discuss treatment options.  She has not gotten injection in the heel.  She has not seen anyone else prior to seeing me.  Her pain scale is 7 out of 10 hurts with ambulation.  Sharp shooting in nature   Review of Systems: Negative except as noted in the HPI. Denies N/V/F/Ch.  No past medical history on file.  Current Outpatient Medications:    amoxicillin (AMOXIL) 875 MG tablet, Take by mouth., Disp: , Rfl:    aspirin 81 MG EC tablet, Take by mouth., Disp: , Rfl:    polyethylene glycol-electrolytes (NULYTELY) 420 g solution, Take by mouth., Disp: , Rfl:    progesterone (PROMETRIUM) 100 MG capsule, One po for 10 days  If no bleeding every 3 months, Disp: , Rfl:    diclofenac (VOLTAREN) 75 MG EC tablet, Take 1 tablet (75 mg total) by mouth 2 (two) times daily., Disp: 14 tablet, Rfl: 0   polyethylene glycol-electrolytes (NULYTELY) 420 g solution, Take by mouth., Disp: , Rfl:   Social History   Tobacco Use  Smoking Status Never  Smokeless Tobacco Never    No Known Allergies Objective:  There were no vitals filed for this visit. There is no height or weight on file to calculate BMI. Constitutional Well developed. Well nourished.  Vascular Dorsalis pedis pulses palpable bilaterally. Posterior tibial pulses palpable bilaterally. Capillary refill normal to all digits.  No cyanosis or clubbing noted. Pedal hair growth normal.  Neurologic Normal speech. Oriented to person, place, and time. Epicritic sensation to  light touch grossly present bilaterally.  Dermatologic Nails well groomed and normal in appearance. No open wounds. No skin lesions.  Orthopedic: Normal joint ROM without pain or crepitus bilaterally. No visible deformities. Tender to palpation at the calcaneal tuber right. No pain with calcaneal squeeze right. Ankle ROM diminished range of motion right. Silfverskiold Test: positive right.   Radiographs: Taken and reviewed. No acute fractures or dislocations. No evidence of stress fracture.  Plantar heel spur present. Posterior heel spur absent.  Pes planovalgus foot structure  Assessment:   1. Plantar fasciitis of right foot    Plan:  Patient was evaluated and treated and all questions answered.  Plantar Fasciitis, right - XR reviewed as above.  - Educated on icing and stretching. Instructions given.  - Injection delivered to the plantar fascia as below. - DME: Plantar Fascial Brace - Pharmacologic management: None  Procedure: Injection Tendon/Ligament Location: Right plantar fascia at the glabrous junction; medial approach. Skin Prep: alcohol Injectate: 0.5 cc 0.5% marcaine plain, 0.5 cc of 1% Lidocaine, 0.5 cc kenalog 10. Disposition: Patient tolerated procedure well. Injection site dressed with a band-aid.  No follow-ups on file.

## 2021-03-03 ENCOUNTER — Ambulatory Visit (INDEPENDENT_AMBULATORY_CARE_PROVIDER_SITE_OTHER): Payer: 59 | Admitting: Podiatry

## 2021-03-03 ENCOUNTER — Other Ambulatory Visit: Payer: Self-pay

## 2021-03-03 DIAGNOSIS — Q666 Other congenital valgus deformities of feet: Secondary | ICD-10-CM | POA: Diagnosis not present

## 2021-03-03 DIAGNOSIS — M722 Plantar fascial fibromatosis: Secondary | ICD-10-CM | POA: Diagnosis not present

## 2021-03-03 DIAGNOSIS — L853 Xerosis cutis: Secondary | ICD-10-CM

## 2021-03-03 MED ORDER — AMMONIUM LACTATE 12 % EX LOTN: 1.0000 "application " | TOPICAL_LOTION | CUTANEOUS | 0 refills | Status: DC | PRN

## 2021-03-05 ENCOUNTER — Encounter: Payer: Self-pay | Admitting: Podiatry

## 2021-03-05 NOTE — Progress Notes (Signed)
Subjective:  Patient ID: Mindy Mack, female    DOB: 1965/06/05,  MRN: [id number]  Chief Complaint  Patient presents with   Plantar Fasciitis    Pt stated that she is doing much better    55 y.o. female presents with the above complaint.  Patient presents for follow-up of right Planter fasciitis.  She states she is doing a lot better.  She states the injection helped.  She would like to discuss treatment options.  She has secondary complaint dry skin.  Is been going for quite some time.  She is tried over-the-counter lotion which has not helped.  She denies any acute complaints.   Review of Systems: Negative except as noted in the HPI. Denies N/V/F/Ch.  No past medical history on file.  Current Outpatient Medications:    ammonium lactate (AMLACTIN) 12 % lotion, Apply 1 application topically as needed for dry skin., Disp: 400 g, Rfl: 0   amoxicillin (AMOXIL) 875 MG tablet, Take by mouth., Disp: , Rfl:    aspirin 81 MG EC tablet, Take by mouth., Disp: , Rfl:    diclofenac (VOLTAREN) 75 MG EC tablet, Take 1 tablet (75 mg total) by mouth 2 (two) times daily., Disp: 14 tablet, Rfl: 0   polyethylene glycol-electrolytes (NULYTELY) 420 g solution, Take by mouth., Disp: , Rfl:    polyethylene glycol-electrolytes (NULYTELY) 420 g solution, Take by mouth., Disp: , Rfl:    progesterone (PROMETRIUM) 100 MG capsule, One po for 10 days  If no bleeding every 3 months, Disp: , Rfl:   Social History   Tobacco Use  Smoking Status Never  Smokeless Tobacco Never    No Known Allergies Objective:  There were no vitals filed for this visit. There is no height or weight on file to calculate BMI. Constitutional Well developed. Well nourished.  Vascular Dorsalis pedis pulses palpable bilaterally. Posterior tibial pulses palpable bilaterally. Capillary refill normal to all digits.  No cyanosis or clubbing noted. Pedal hair growth normal.  Neurologic Normal speech. Oriented to person, place,  and time. Epicritic sensation to light touch grossly present bilaterally.  Dermatologic Nails well groomed and normal in appearance. No open wounds. No skin lesions.  Orthopedic: Normal joint ROM without pain or crepitus bilaterally. No visible deformities. No further tender to palpation at the calcaneal tuber right. No pain with calcaneal squeeze right. Ankle ROM diminished range of motion right. Silfverskiold Test: positive right. Xerosis noted to bilateral foot without skin fissures.  Mild pain.   Radiographs: Taken and reviewed. No acute fractures or dislocations. No evidence of stress fracture.  Plantar heel spur present. Posterior heel spur absent.  Pes planovalgus foot structure  Assessment:   1. Plantar fasciitis of right foot   2. Xerosis of skin   3. Pes planovalgus     Plan:  Patient was evaluated and treated and all questions answered.  Xerosis bilateral foot I explained to the patient the etiology of xerosis and various treatment options were extensively discussed.  I explained to the patient the importance of maintaining moisturization of the skin with application of over-the-counter lotion such as Eucerin or Luciderm.  Given that she has failed over-the-counter treatment options I believe she will benefit from ammonium lactate.  I have asked her to apply twice a day.  She states understanding will do so  Pes planovalgus -I explained to patient the etiology of pes planovalgus and relationship with Planter fasciitis and various treatment options were discussed.  Given patient foot structure in  the setting of Planter fasciitis I believe patient will benefit from custom-made orthotics to help control the hindfoot motion support the arch of the foot and take the stress away from plantar fascial.  Patient agrees with the plan like to proceed with orthotics -Patient was casted for orthotics    Plantar Fasciitis, right -Clinically healed.  I discussed shoe gear modification  as well as orthotics.  No follow-ups on file.

## 2021-04-17 ENCOUNTER — Other Ambulatory Visit: Payer: Self-pay

## 2021-04-17 ENCOUNTER — Telehealth: Payer: Self-pay

## 2021-04-17 NOTE — Telephone Encounter (Signed)
Spoke with patient and she was not aware that her orthotics would not be covered by her insurance. She stated that she is going to contact the insurance company for further explanation and will call back if there is something that they need from [place]. Orthotics are still in the [place] office until the pt calls back with final decision.

## 2021-04-29 ENCOUNTER — Other Ambulatory Visit: Payer: Self-pay

## 2021-04-29 ENCOUNTER — Ambulatory Visit
Admission: EM | Admit: 2021-04-29 | Discharge: 2021-04-29 | Disposition: A | Discharge status: Home / Self Care | Payer: 59 | Attending: Emergency Medicine | Admitting: Emergency Medicine

## 2021-04-29 ENCOUNTER — Encounter: Payer: Self-pay | Admitting: Emergency Medicine

## 2021-04-29 DIAGNOSIS — A084 Viral intestinal infection, unspecified: Secondary | ICD-10-CM

## 2021-04-29 MED ORDER — ALUMINUM-MAGNESIUM-SIMETHICONE 200-200-20 MG/5ML PO SUSP: 30.0000 mL | Freq: Three times a day (TID) | ORAL | 0 refills | Status: DC

## 2021-04-29 MED ORDER — DIPHENOXYLATE-ATROPINE 2.5-0.025 MG PO TABS: 1.0000 | ORAL_TABLET | Freq: Four times a day (QID) | ORAL | 0 refills | Status: AC | PRN

## 2021-04-29 NOTE — ED Provider Notes (Signed)
[facility]    CSN: [id number] Arrival date & time: 04/29/21  0808      History   Chief Complaint Chief Complaint  Patient presents with   Generalized Body Aches    HPI Karmen Altamirano is a 56 y.o. female.   Presents with generalized abdominal pain, diarrhea, subjective fevers, fatigue, generalized headaches and a nonproductive cough for 3 days.  Abdominal pain is described as rumbling, does not radiate.  Last episode of diarrhea 1 day ago.  Endorses minimal food and fluid intake.  No known sick contacts.  Has attempted use of over-the-counter cold and flu medicine which has not been helpful.  No pertinent medical history.   History reviewed. No pertinent past medical history.  There are no problems to display for this patient.   Past Surgical History:  Procedure Laterality Date   APPENDECTOMY     BACK SURGERY     CESAREAN SECTION     COLONOSCOPY WITH PROPOFOL N/A 01/09/2021   Procedure: COLONOSCOPY WITH PROPOFOL;  Surgeon: Lesly Rubenstein, MD;  Location: [facility];  Service: Endoscopy;  Laterality: N/A;   JOINT REPLACEMENT      OB History   No obstetric history on file.      Home Medications    Prior to Admission medications   Medication Sig Start Date End Date Taking? Authorizing Provider  ammonium lactate (AMLACTIN) 12 % lotion Apply 1 application topically as needed for dry skin. 03/03/21   Felipa Furnace, DPM  amoxicillin (AMOXIL) 875 MG tablet Take by mouth. 03/27/09   [provider]  aspirin 81 MG EC tablet Take by mouth. 09/26/15   [provider]  diclofenac (VOLTAREN) 75 MG EC tablet Take 1 tablet (75 mg total) by mouth 2 (two) times daily. 01/30/21   Rodriguez-Southworth, Sunday Spillers, PA-C  polyethylene glycol-electrolytes (NULYTELY) 420 g solution Take by mouth. 09/24/20   [provider]  polyethylene glycol-electrolytes (NULYTELY) 420 g solution Take by mouth. 09/24/20   [provider]   progesterone (PROMETRIUM) 100 MG capsule One po for 10 days  If no bleeding every 3 months 07/18/19   [provider]    Family History Family History  Problem Relation Age of Onset   Hypertension Mother    Dementia Father    Parkinson's disease Father     Social History Social History   Tobacco Use   Smoking status: Never   Smokeless tobacco: Never  Vaping Use   Vaping Use: Never used  Substance Use Topics   Alcohol use: Yes    Comment: occasionally   Drug use: No     Allergies   Patient has no known allergies.   Review of Systems Review of Systems  Constitutional:  Positive for appetite change, fatigue and fever. Negative for activity change, chills, diaphoresis and unexpected weight change.  HENT: Negative.    Respiratory:  Positive for cough. Negative for apnea, choking, chest tightness, shortness of breath, wheezing and stridor.   Cardiovascular: Negative.   Gastrointestinal:  Positive for abdominal pain and diarrhea. Negative for abdominal distention, anal bleeding, blood in stool, constipation, nausea, rectal pain and vomiting.  Musculoskeletal:  Positive for myalgias. Negative for arthralgias, back pain, gait problem, joint swelling, neck pain and neck stiffness.  Skin: Negative.   Neurological:  Positive for headaches. Negative for dizziness, tremors, seizures, syncope, facial asymmetry, speech difficulty, weakness, light-headedness and numbness.    Physical Exam Triage Vital Signs ED Triage Vitals  Enc Vitals Group  BP 04/29/21 0826 (!) 130/91     Pulse Rate 04/29/21 0826 84     Resp 04/29/21 0826 18     Temp 04/29/21 0826 98.7 F (37.1 C)     Temp Source 04/29/21 0826 Oral     SpO2 04/29/21 0826 98 %     Weight 04/29/21 0824 292 lb 15.9 oz (132.9 kg)     Height 04/29/21 0824 5\' 5"  (1.651 m)     Head Circumference --      Peak Flow --      Pain Score 04/29/21 0823 6     Pain Loc --      Pain Edu? --      Excl. in [place]? --    No data  found.  Updated Vital Signs BP (!) 130/91 (BP Location: Right Arm)    Pulse 84    Temp 98.7 F (37.1 C) (Oral)    Resp 18    Ht 5\' 5"  (1.651 m)    Wt 292 lb 15.9 oz (132.9 kg)    LMP 10/29/2019 (Approximate)    SpO2 98%    BMI 48.76 kg/m   Visual Acuity Right Eye Distance:   Left Eye Distance:   Bilateral Distance:    Right Eye Near:   Left Eye Near:    Bilateral Near:     Physical Exam Constitutional:      Appearance: Normal appearance.  HENT:     Head: Normocephalic.  Eyes:     Extraocular Movements: Extraocular movements intact.  Pulmonary:     Effort: Pulmonary effort is normal.  Abdominal:     General: Abdomen is flat. Bowel sounds are increased.     Palpations: Abdomen is soft.     Tenderness: There is generalized abdominal tenderness.  Skin:    General: Skin is warm and dry.  Neurological:     Mental Status: She is alert and oriented to person, place, and time. Mental status is at baseline.  Psychiatric:        Mood and Affect: Mood normal.        Behavior: Behavior normal.     UC Treatments / Results  Labs (all labs ordered are listed, but only abnormal results are displayed) Labs Reviewed - No data to display  EKG   Radiology No results found.  Procedures Procedures (including critical care time)  Medications Ordered in UC Medications - No data to display  Initial Impression / Assessment and Plan / UC Course  I have reviewed the triage vital signs and the nursing notes.  Pertinent labs & imaging results that were available during my care of the patient were reviewed by me and considered in my medical decision making (see chart for details).  Viral gastroenteritis  Vital signs are stable, patient is in no signs of distress, generalized tenderness noted on exam, low suspicion for acute abdomen, discussed findings with patient, etiology of symptoms is most likely viral and should resolve on their own without complication, prescribed Lomotil and  Maalox for management of symptoms, recommend increase fluid intake to supplement decreased appetite through use of water and electrolyte replacement substances, may eat food as tolerated, may use over-the-counter Tylenol and ibuprofen for further comfort, urgent care follow-up as needed, work note given Final Clinical Impressions(s) / UC Diagnoses   Final diagnoses:  None   Discharge Instructions   None    ED Prescriptions   None    PDMP not reviewed this encounter.   Louiza Moor, [place],  NP 04/29/21 0900

## 2021-04-29 NOTE — Discharge Instructions (Signed)
Your symptoms are most likely caused by a virus, it will work its way out your system over the next few days  You can use Maalox every 6 hours as needed to help calm stomach acid and reduce gas production, if attempting to eat, take 30 minutes to an hour prior to  You can use Lomotil up to 4 times a day to help with diarrhea, and be mindful over use of this medication may cause opposite effect constipation  You can use over-the-counter ibuprofen or Tylenol, which ever you have at home, to help manage fevers  Continue to promote hydration throughout the day by using electrolyte replacement solution such as Gatorade, body armor, Pedialyte, which ever you have at home  Try eating bland foods such as bread, rice, toast, fruit which are easier on the stomach to digest, avoid foods that are overly spicy, overly seasoned or greasy

## 2021-04-29 NOTE — ED Triage Notes (Signed)
Pt c/o body aches, subjective fever, fatigue, diarrhea, weakness, cough, and headache. Started about 3 days ago.

## 2022-06-12 ENCOUNTER — Ambulatory Visit
Admission: EM | Admit: 2022-06-12 | Discharge: 2022-06-12 | Disposition: A | Discharge status: Home / Self Care | Payer: 59 | Attending: Emergency Medicine | Admitting: Emergency Medicine

## 2022-06-12 DIAGNOSIS — J0100 Acute maxillary sinusitis, unspecified: Secondary | ICD-10-CM | POA: Diagnosis not present

## 2022-06-12 MED ORDER — AMOXICILLIN-POT CLAVULANATE 875-125 MG PO TABS: 1.0000 | ORAL_TABLET | Freq: Two times a day (BID) | ORAL | 0 refills | Status: AC

## 2022-06-12 NOTE — ED Triage Notes (Signed)
Pt states that she's having pain in her left side of her face. Near eye. Having bloody mucus on left side of nose when she blows her nose. X 3 weeks. OTC meds for sinus. No fever, small cough.

## 2022-06-12 NOTE — ED Provider Notes (Signed)
[facility]    CSN: [id number] Arrival date & time: 06/12/22  0800      History   Chief Complaint Chief Complaint  Patient presents with   Facial Pain    HPI Mindy Mack is a 57 y.o. female.   HPI  57 year old female with no significant past medical history presents for evaluation of 3 weeks of pain in her left cheek, left eye, and bloody nasal discharge.  She also endorses an infrequent nonproductive cough.  She denies any fever, pain in her teeth, ear pain, or sore throat.  History reviewed. No pertinent past medical history.  There are no problems to display for this patient.   Past Surgical History:  Procedure Laterality Date   APPENDECTOMY     BACK SURGERY     CESAREAN SECTION     COLONOSCOPY WITH PROPOFOL N/A 01/09/2021   Procedure: COLONOSCOPY WITH PROPOFOL;  Surgeon: Regis BillLocklear, Cameron T, MD;  Location: [facility] ENDOSCOPY;  Service: Endoscopy;  Laterality: N/A;   JOINT REPLACEMENT      OB History   No obstetric history on file.      Home Medications    Prior to Admission medications   Medication Sig Start Date End Date Taking? Authorizing Provider  amoxicillin-clavulanate (AUGMENTIN) 875-125 MG tablet Take 1 tablet by mouth every 12 (twelve) hours for 10 days. 06/12/22 06/22/22 Yes Becky Augustayan, Jahlen Bollman, NP    Family History Family History  Problem Relation Age of Onset   Hypertension Mother    Dementia Father    Parkinson's disease Father     Social History Social History   Tobacco Use   Smoking status: Never    Passive exposure: Never   Smokeless tobacco: Never  Vaping Use   Vaping Use: Never used  Substance Use Topics   Alcohol use: Yes    Comment: occasionally   Drug use: No     Allergies   Patient has no known allergies.   Review of Systems Review of Systems  Constitutional:  Negative for fever.  HENT:  Positive for congestion and sinus pain. Negative for ear pain and sore throat.   Eyes:  Positive for pain.   Respiratory:  Positive for cough.      Physical Exam Triage Vital Signs ED Triage Vitals [06/12/22 0813]  Enc Vitals Group     BP      Pulse      Resp      Temp      Temp src      SpO2      Weight 295 lb (133.8 kg)     Height      Head Circumference      Peak Flow      Pain Score 8     Pain Loc      Pain Edu?      Excl. in GC?    No data found.  Updated Vital Signs BP (!) 142/86 (BP Location: Left Arm)   Pulse 93   Temp 98.5 F (36.9 C) (Oral)   Resp 18   Wt 295 lb (133.8 kg)   LMP 05/21/2018   SpO2 93%   BMI 49.09 kg/m   Visual Acuity Right Eye Distance:   Left Eye Distance:   Bilateral Distance:    Right Eye Near:   Left Eye Near:    Bilateral Near:     Physical Exam Vitals and nursing note reviewed.  Constitutional:      Appearance: Normal appearance. She  is not ill-appearing.  HENT:     Head: Normocephalic and atraumatic.     Right Ear: Tympanic membrane, ear canal and external ear normal. There is no impacted cerumen.     Left Ear: Tympanic membrane, ear canal and external ear normal. There is no impacted cerumen.     Nose: Congestion and rhinorrhea present.     Comments: Nasal mucosa is erythematous and edematous with bloody discharge in the left nare.  No tenderness to compression of frontal or maxillary sinuses bilaterally.    Mouth/Throat:     Mouth: Mucous membranes are moist.     Pharynx: Oropharynx is clear. No oropharyngeal exudate or posterior oropharyngeal erythema.  Cardiovascular:     Rate and Rhythm: Normal rate and regular rhythm.     Pulses: Normal pulses.     Heart sounds: Normal heart sounds. No murmur heard.    No friction rub. No gallop.  Pulmonary:     Effort: Pulmonary effort is normal.     Breath sounds: Normal breath sounds. No wheezing, rhonchi or rales.  Musculoskeletal:     Cervical back: Normal range of motion and neck supple.  Lymphadenopathy:     Cervical: No cervical adenopathy.  Skin:    General: Skin is  warm and dry.     Capillary Refill: Capillary refill takes less than 2 seconds.     Findings: No rash.  Neurological:     General: No focal deficit present.     Mental Status: She is alert and oriented to person, place, and time.      UC Treatments / Results  Labs (all labs ordered are listed, but only abnormal results are displayed) Labs Reviewed - No data to display  EKG   Radiology No results found.  Procedures Procedures (including critical care time)  Medications Ordered in UC Medications - No data to display  Initial Impression / Assessment and Plan / UC Course  I have reviewed the triage vital signs and the nursing notes.  Pertinent labs & imaging results that were available during my care of the patient were reviewed by me and considered in my medical decision making (see chart for details).   Patient is a pleasant, nontoxic-appearing 57 year old female here for evaluation of 3 weeks worth of sinus complaints as outlined in HPI above.  She states her primary complaint is the pain in her left cheek that radiates up into her eye.  She states that it was so bad last night she had to leave work.  She has been getting bloody discharge on the left nare only for the last 3 weeks.  On exam she does have inflamed nasal mucosa and there is bloody discharge in the left nare.  Her sinuses are nontender to compression.  Cardiopulmonary exam is benign.  I suspect that the patient has maxillary sinusitis and I will treat her with Augmentin 875 twice daily for 10 days.  We also discussed sinus irrigation to help alleviate the mucus burden and help relieve some of the pressure that she is feeling.  Return precautions reviewed.   Final Clinical Impressions(s) / UC Diagnoses   Final diagnoses:  Acute non-recurrent maxillary sinusitis     Discharge Instructions      The Augmentin twice daily with food for 10 days for treatment of your sinusitis.  Perform sinus irrigation 2-3 times  a day with a NeilMed sinus rinse kit and distilled water.  Do not use tap water.  You can use plain over-the-counter  Mucinex every 6 hours to break up the stickiness of the mucus so your body can clear it.  Increase your oral fluid intake to thin out your mucus so that is also able for your body to clear more easily.  Take an over-the-counter probiotic, such as Culturelle-align-activia, 1 hour after each dose of antibiotic to prevent diarrhea.  If you develop any new or worsening symptoms return for reevaluation or see your primary care provider.      ED Prescriptions     Medication Sig Dispense Auth. Provider   amoxicillin-clavulanate (AUGMENTIN) 875-125 MG tablet Take 1 tablet by mouth every 12 (twelve) hours for 10 days. 20 tablet Becky Augusta, NP      PDMP not reviewed this encounter.   Becky Augusta, NP 06/12/22 [phone]

## 2022-06-12 NOTE — Discharge Instructions (Signed)
The Augmentin twice daily with food for 10 days for treatment of your sinusitis.  Perform sinus irrigation 2-3 times a day with a NeilMed sinus rinse kit and distilled water.  Do not use tap water.  You can use plain over-the-counter Mucinex every 6 hours to break up the stickiness of the mucus so your body can clear it.  Increase your oral fluid intake to thin out your mucus so that is also able for your body to clear more easily.  Take an over-the-counter probiotic, such as Culturelle-align-activia, 1 hour after each dose of antibiotic to prevent diarrhea.  If you develop any new or worsening symptoms return for reevaluation or see your primary care provider.  

## 2022-09-14 ENCOUNTER — Encounter: Payer: Self-pay | Admitting: Oncology

## 2022-09-14 ENCOUNTER — Inpatient Hospital Stay: Payer: 59 | Attending: Oncology | Admitting: Oncology

## 2022-09-14 ENCOUNTER — Inpatient Hospital Stay: Payer: 59

## 2022-09-14 VITALS — BP 137/78 | HR 65 | Temp 96.8°F | Resp 18 | Wt 278.4 lb

## 2022-09-14 DIAGNOSIS — Z809 Family history of malignant neoplasm, unspecified: Secondary | ICD-10-CM | POA: Diagnosis not present

## 2022-09-14 DIAGNOSIS — D709 Neutropenia, unspecified: Secondary | ICD-10-CM | POA: Diagnosis present

## 2022-09-14 DIAGNOSIS — D72819 Decreased white blood cell count, unspecified: Secondary | ICD-10-CM | POA: Insufficient documentation

## 2022-09-14 DIAGNOSIS — D708 Other neutropenia: Secondary | ICD-10-CM

## 2022-09-14 LAB — FOLATE: Folate: 9.8 ng/mL (ref >5.9)

## 2022-09-14 LAB — CMP (CANCER CENTER ONLY)
ALT: 11 U/L (ref 0–44)
AST: 16 U/L (ref 15–41)
Albumin: 4 g/dL (ref 3.5–5.0)
Alkaline Phosphatase: 87 U/L (ref 38–126)
Anion gap: 9 (ref 5–15)
BUN: 10 mg/dL (ref 6–20)
CO2: 27 mmol/L (ref 22–32)
Calcium: 9 mg/dL (ref 8.9–10.3)
Chloride: 102 mmol/L (ref 98–111)
Creatinine: 0.54 mg/dL (ref 0.44–1.00)
GFR, Estimated: >60 mL/min (ref >60)
Glucose, Bld: 89 mg/dL (ref 70–99)
Potassium: 3.7 mmol/L (ref 3.5–5.1)
Sodium: 138 mmol/L (ref 135–145)
Total Bilirubin: 1.1 mg/dL (ref 0.3–1.2)
Total Protein: 7.6 g/dL (ref 6.5–8.1)

## 2022-09-14 LAB — TECHNOLOGIST SMEAR REVIEW: Plt Morphology: ADEQUATE

## 2022-09-14 LAB — CBC WITH DIFFERENTIAL/PLATELET
Abs Immature Granulocytes: 0 10*3/uL (ref 0.00–0.07)
Basophils Absolute: 0 10*3/uL (ref 0.0–0.1)
Basophils Relative: 0 %
Eosinophils Absolute: 0.1 10*3/uL (ref 0.0–0.5)
Eosinophils Relative: 2 %
HCT: 39.6 % (ref 36.0–46.0)
Hemoglobin: 12.9 g/dL (ref 12.0–15.0)
Immature Granulocytes: 0 %
Lymphocytes Relative: 42 %
Lymphs Abs: 1.5 10*3/uL (ref 0.7–4.0)
MCH: 29.1 pg (ref 26.0–34.0)
MCHC: 32.6 g/dL (ref 30.0–36.0)
MCV: 89.4 fL (ref 80.0–100.0)
Monocytes Absolute: 0.3 10*3/uL (ref 0.1–1.0)
Monocytes Relative: 7 %
Neutro Abs: 1.8 10*3/uL (ref 1.7–7.7)
Neutrophils Relative %: 49 %
Platelets: 191 10*3/uL (ref 150–400)
RBC: 4.43 MIL/uL (ref 3.87–5.11)
RDW: 12.6 % (ref 11.5–15.5)
WBC: 3.6 10*3/uL — ABNORMAL LOW (ref 4.0–10.5)
nRBC: 0 % (ref 0.0–0.2)

## 2022-09-14 LAB — HEPATITIS PANEL, ACUTE
HCV Ab: NONREACTIVE
Hep A IgM: NONREACTIVE
Hep B C IgM: NONREACTIVE
Hepatitis B Surface Ag: NONREACTIVE

## 2022-09-14 LAB — LACTATE DEHYDROGENASE: LDH: 159 U/L (ref 98–192)

## 2022-09-14 LAB — VITAMIN B12: Vitamin B-12: 247 pg/mL (ref 180–914)

## 2022-09-14 LAB — HIV ANTIBODY (ROUTINE TESTING W REFLEX): HIV Screen 4th Generation wRfx: NONREACTIVE

## 2022-09-14 NOTE — Progress Notes (Signed)
Hematology/Oncology Consult Note [phone] [phone]    REFERRING PROVIDER: Suzy Bouchard,*   CHIEF COMPLAINTS/REASON FOR VISIT:  Evaluation of leukopenia   ASSESSMENT & PLAN:   Other neutropenia (HCC)  I discussed with patient that the differential diagnosis of leukopenia is broad, including acute or chronic infection, inflammation, nutrition deficiency, autoimmune disease,  ethnic, or malignant etiology including underlying bone morrow disorders.  For the work up of patient's leukoepenia, I recommend checking CBC;CMP, LDH; smear review, folate, Vitamin B12, hepatitis, HIV, flowcytometry ./   Orders Placed This Encounter  Procedures   CBC with Differential/Platelet    Standing Status:   Future    Number of Occurrences:   1    Standing Expiration Date:   09/14/2023   Vitamin B12    Standing Status:   Future    Number of Occurrences:   1    Standing Expiration Date:   09/14/2023   Folate    Standing Status:   Future    Number of Occurrences:   1    Standing Expiration Date:   09/14/2023   Flow cytometry panel-leukemia/lymphoma work-up    Standing Status:   Future    Number of Occurrences:   1    Standing Expiration Date:   09/14/2023   Lactate dehydrogenase    Standing Status:   Future    Number of Occurrences:   1    Standing Expiration Date:   09/14/2023   Hepatitis panel, acute    Standing Status:   Future    Number of Occurrences:   1    Standing Expiration Date:   09/14/2023   HIV Antibody (routine testing w rflx)    Standing Status:   Future    Number of Occurrences:   1    Standing Expiration Date:   09/14/2023   Technologist smear review    Standing Status:   Future    Number of Occurrences:   1    Standing Expiration Date:   09/14/2023    Order Specific Question:   Clinical information:    Answer:   neutropenia   CMP (Cancer Center only)    Standing Status:   Future    Number of Occurrences:   1    Standing Expiration Date:    09/14/2023   Follow up in 3-4 weeks.  All questions were answered. The patient knows to call the clinic with any problems, questions or concerns.  Rickard Patience, MD, PhD [facility] Hematology Oncology 09/14/2022    HISTORY OF PRESENTING ILLNESS:  Mindy Mack is a 57 y.o. female who was seen in consultation at the request of Schermerhorn, Ihor Austin,* for evaluation of leukopenia Reviewed patient's recent labs. Patient has low total WBC count was , predominantly neutropenia. Previous lab records reviewed. Leukopenia duration is due to onset.  Patient has a normal white count last year.  Patient denies fatigue, weight loss, fever, chills, frequent infection.  Denies history hepatitis or HIV infection Denies history of chronic liver disease Denies routine alcohol consumption. Denies dietary restrictions.  Denies Herbal medication    MEDICAL HISTORY:  History reviewed. No pertinent past medical history.  SURGICAL HISTORY: Past Surgical History:  Procedure Laterality Date   APPENDECTOMY     BACK SURGERY     CESAREAN SECTION     COLONOSCOPY WITH PROPOFOL N/A 01/09/2021   Procedure: COLONOSCOPY WITH PROPOFOL;  Surgeon: Regis Bill, MD;  Location: [facility] ENDOSCOPY;  Service: Endoscopy;  Laterality: N/A;  JOINT REPLACEMENT      SOCIAL HISTORY: Social History   Socioeconomic History   Marital status: Divorced    Spouse name: Not on file   Number of children: Not on file   Years of education: Not on file   Highest education level: Not on file  Occupational History   Not on file  Tobacco Use   Smoking status: Never    Passive exposure: Never   Smokeless tobacco: Never  Vaping Use   Vaping Use: Never used  Substance and Sexual Activity   Alcohol use: Yes    Comment: occasionally   Drug use: No   Sexual activity: Not Currently  Other Topics Concern   Not on file  Social History Narrative   Not on file   Social Determinants of Health   Financial Resource  Strain: Low Risk  (09/14/2022)   Overall Financial Resource Strain (CARDIA)    Difficulty of Paying Living Expenses: Not very hard  Food Insecurity: No Food Insecurity (09/14/2022)   Hunger Vital Sign    Worried About Running Out of Food in the Last Year: Never true    Ran Out of Food in the Last Year: Never true  Transportation Needs: No Transportation Needs (09/14/2022)   PRAPARE - Administrator, Civil Service (Medical): No    Lack of Transportation (Non-Medical): No  Physical Activity: Not on file  Stress: Not on file  Social Connections: Not on file  Intimate Partner Violence: Not At Risk (09/14/2022)   Humiliation, Afraid, Rape, and Kick questionnaire    Fear of Current or Ex-Partner: No    Emotionally Abused: No    Physically Abused: No    Sexually Abused: No    FAMILY HISTORY: Family History  Problem Relation Age of Onset   Cancer Mother    Hypertension Mother    Cancer Father    Dementia Father    Parkinson's disease Father    Cancer Sister    Cancer Brother    Diabetes Paternal Uncle    Cancer Paternal Uncle    Cancer Maternal Grandmother    Diabetes Maternal Grandfather     ALLERGIES:  has No Known Allergies.  MEDICATIONS:  No current outpatient medications on file.   No current facility-administered medications for this visit.     Review of Systems  Constitutional:  Negative for appetite change, chills, fatigue and fever.  HENT:   Negative for hearing loss and voice change.   Eyes:  Negative for eye problems.  Respiratory:  Negative for chest tightness and cough.   Cardiovascular:  Negative for chest pain.  Gastrointestinal:  Negative for abdominal distention, abdominal pain and blood in stool.  Endocrine: Negative for hot flashes.  Genitourinary:  Negative for difficulty urinating and frequency.   Musculoskeletal:  Positive for arthralgias.  Skin:  Negative for itching and rash.  Neurological:  Negative for extremity weakness.  Hematological:   Negative for adenopathy.  Psychiatric/Behavioral:  Negative for confusion.     PHYSICAL EXAMINATION: ECOG PERFORMANCE STATUS: 0 - Asymptomatic Vitals:   09/14/22 0922  BP: 137/78  Pulse: 65  Resp: 18  Temp: (!) 96.8 F (36 C)  SpO2: 98%   Filed Weights   09/14/22 0922  Weight: 278 lb 6.4 oz (126.3 kg)     Physical Exam Constitutional:      General: She is not in acute distress. HENT:     Head: Normocephalic and atraumatic.  Eyes:     General: No scleral  icterus. Cardiovascular:     Rate and Rhythm: Normal rate and regular rhythm.     Heart sounds: Normal heart sounds.  Pulmonary:     Effort: Pulmonary effort is normal. No respiratory distress.     Breath sounds: No wheezing.  Abdominal:     General: Bowel sounds are normal. There is no distension.     Palpations: Abdomen is soft.  Musculoskeletal:        General: No deformity. Normal range of motion.     Cervical back: Normal range of motion and neck supple.  Skin:    General: Skin is warm and dry.     Findings: No erythema or rash.  Neurological:     Mental Status: She is alert and oriented to person, place, and time. Mental status is at baseline.     Cranial Nerves: No cranial nerve deficit.     Coordination: Coordination normal.  Psychiatric:        Mood and Affect: Mood normal.     RADIOGRAPHIC STUDIES: I have personally reviewed the radiological images as listed and agreed with the findings in the report. No results found.   LABORATORY DATA:  I have reviewed the data as listed    Latest Ref Rng & Units 09/14/2022   10:07 AM 08/13/2020    1:53 PM 09/06/2018    2:41 PM  CBC  WBC 4.0 - 10.5 K/uL 3.6  4.1  4.4   Hemoglobin 12.0 - 15.0 g/dL 16.1  09.6  04.5   Hematocrit 36.0 - 46.0 % 39.6  42.7  41.5   Platelets 150 - 400 K/uL 191  235  268       Latest Ref Rng & Units 09/14/2022   10:07 AM 09/06/2018    2:41 PM  CMP  Glucose 70 - 99 mg/dL 89  409   BUN 6 - 20 mg/dL 10  15   Creatinine 8.11 - 1.00  mg/dL 9.14  7.82   Sodium 956 - 145 mmol/L 138  137   Potassium 3.5 - 5.1 mmol/L 3.7  3.4   Chloride 98 - 111 mmol/L 102  105   CO2 22 - 32 mmol/L 27  24   Calcium 8.9 - 10.3 mg/dL 9.0  8.8   Total Protein 6.5 - 8.1 g/dL 7.6    Total Bilirubin 0.3 - 1.2 mg/dL 1.1    Alkaline Phos 38 - 126 U/L 87    AST 15 - 41 U/L 16    ALT 0 - 44 U/L 11         RADIOGRAPHIC STUDIES: I have personally reviewed the radiological images as listed and agreed with the findings in the report. No results found.

## 2022-09-14 NOTE — Assessment & Plan Note (Signed)
I discussed with patient that the differential diagnosis of leukopenia is broad, including acute or chronic infection, inflammation, nutrition deficiency, autoimmune disease,  ethnic, or malignant etiology including underlying bone morrow disorders.  For the work up of patient's leukoepenia, I recommend checking CBC;CMP, LDH; smear review, folate, Vitamin B12, hepatitis, HIV, flowcytometry ./

## 2022-09-16 LAB — COMP PANEL: LEUKEMIA/LYMPHOMA

## 2022-10-05 ENCOUNTER — Inpatient Hospital Stay: Payer: 59 | Admitting: Oncology

## 2022-10-05 ENCOUNTER — Encounter: Payer: Self-pay | Admitting: Oncology

## 2022-10-05 VITALS — BP 137/77 | HR 61 | Temp 96.0°F | Resp 18 | Wt 272.0 lb

## 2022-10-05 DIAGNOSIS — D709 Neutropenia, unspecified: Secondary | ICD-10-CM | POA: Diagnosis not present

## 2022-10-05 DIAGNOSIS — D708 Other neutropenia: Secondary | ICD-10-CM

## 2022-10-05 MED ORDER — VITAMIN B-12 1000 MCG PO TABS: 1000.0000 ug | ORAL_TABLET | Freq: Every day | ORAL | 4 refills | Status: AC

## 2022-10-05 NOTE — Assessment & Plan Note (Signed)
Labs are reviewed and discussed with patient.   CBC showed slightly decreased total white count, complete normal differential, including normal absolute neutrophil levels.  Negative peripheral blood flow cytometry, normal B12 and folate, negative HIV, negative hepatitis panel.  Normal LDH. I recommend observation.  No intervention needed.  Borderline B12 level, recommend patient to take vitamin B12 1000 mcg daily.

## 2022-10-05 NOTE — Progress Notes (Signed)
Hematology/Oncology Consult Note [phone] [phone]    REFERRING PROVIDER: [facility]   CHIEF COMPLAINTS/REASON FOR VISIT:  Leukopenia   ASSESSMENT & PLAN:   Other neutropenia (HCC) Labs are reviewed and discussed with patient.   CBC showed slightly decreased total white count, complete normal differential, including normal absolute neutrophil levels.  Negative peripheral blood flow cytometry, normal B12 and folate, negative HIV, negative hepatitis panel.  Normal LDH. I recommend observation.  No intervention needed.  Borderline B12 level, recommend patient to take vitamin B12 1000 mcg daily.   Orders Placed This Encounter  Procedures   CBC with Differential (Cancer Center Only)    Standing Status:   Future    Standing Expiration Date:   10/05/2023   Vitamin B12    Standing Status:   Future    Standing Expiration Date:   10/05/2023   Follow up in 3-4 weeks.  All questions were answered. The patient knows to call the clinic with any problems, questions or concerns.  Rickard Patience, MD, PhD [facility] Hematology Oncology 10/05/2022    HISTORY OF PRESENTING ILLNESS:  Mindy Mack is a 57 y.o. female who was seen in consultation at the request of [facility] for evaluation of leukopenia Reviewed patient's recent labs. Patient has low total WBC count was , predominantly neutropenia. Previous lab records reviewed. Leukopenia duration is due to onset.  Patient has a normal white count last year.  Patient denies fatigue, weight loss, fever, chills, frequent infection.  Denies history hepatitis or HIV infection Denies history of chronic liver disease Denies routine alcohol consumption. Denies dietary restrictions.  Denies Herbal medication   INTERVAL HISTORY Mindy Mack is a 57 y.o. female who has above history reviewed by me today presents for follow up visit to discuss results.  No new complaints.   MEDICAL  HISTORY:  History reviewed. No pertinent past medical history.  SURGICAL HISTORY: Past Surgical History:  Procedure Laterality Date   APPENDECTOMY     BACK SURGERY     CESAREAN SECTION     COLONOSCOPY WITH PROPOFOL N/A 01/09/2021   Procedure: COLONOSCOPY WITH PROPOFOL;  Surgeon: Regis Bill, MD;  Location: [facility] ENDOSCOPY;  Service: Endoscopy;  Laterality: N/A;   JOINT REPLACEMENT      SOCIAL HISTORY: Social History   Socioeconomic History   Marital status: Divorced    Spouse name: Not on file   Number of children: Not on file   Years of education: Not on file   Highest education level: Not on file  Occupational History   Not on file  Tobacco Use   Smoking status: Never    Passive exposure: Never   Smokeless tobacco: Never  Vaping Use   Vaping status: Never Used  Substance and Sexual Activity   Alcohol use: Yes    Comment: occasionally   Drug use: No   Sexual activity: Not Currently  Other Topics Concern   Not on file  Social History Narrative   Not on file   Social Determinants of Health   Financial Resource Strain: Low Risk  (09/14/2022)   Overall Financial Resource Strain (CARDIA)    Difficulty of Paying Living Expenses: Not very hard  Food Insecurity: No Food Insecurity (09/14/2022)   Hunger Vital Sign    Worried About Running Out of Food in the Last Year: Never true    Ran Out of Food in the Last Year: Never true  Transportation Needs: No Transportation Needs (09/14/2022)  PRAPARE - Administrator, Civil Service (Medical): No    Lack of Transportation (Non-Medical): No  Physical Activity: Not on file  Stress: Not on file  Social Connections: Not on file  Intimate Partner Violence: Not At Risk (09/14/2022)   Humiliation, Afraid, Rape, and Kick questionnaire    Fear of Current or Ex-Partner: No    Emotionally Abused: No    Physically Abused: No    Sexually Abused: No    FAMILY HISTORY: Family History  Problem Relation Age of Onset    Cancer Mother    Hypertension Mother    Cancer Father    Dementia Father    Parkinson's disease Father    Cancer Sister    Cancer Brother    Diabetes Paternal Uncle    Cancer Paternal Uncle    Cancer Maternal Grandmother    Diabetes Maternal Grandfather     ALLERGIES:  has No Known Allergies.  MEDICATIONS:  Current Outpatient Medications  Medication Sig Dispense Refill   cyanocobalamin (VITAMIN B12) 1000 MCG tablet Take 1 tablet (1,000 mcg total) by mouth daily. 30 tablet 4   No current facility-administered medications for this visit.     Review of Systems  Constitutional:  Negative for appetite change, chills, fatigue and fever.  HENT:   Negative for hearing loss and voice change.   Eyes:  Negative for eye problems.  Respiratory:  Negative for chest tightness and cough.   Cardiovascular:  Negative for chest pain.  Gastrointestinal:  Negative for abdominal distention, abdominal pain and blood in stool.  Endocrine: Negative for hot flashes.  Genitourinary:  Negative for difficulty urinating and frequency.   Musculoskeletal:  Positive for arthralgias.  Skin:  Negative for itching and rash.  Neurological:  Negative for extremity weakness.  Hematological:  Negative for adenopathy.  Psychiatric/Behavioral:  Negative for confusion.     PHYSICAL EXAMINATION: ECOG PERFORMANCE STATUS: 0 - Asymptomatic Vitals:   10/05/22 1055  BP: 137/77  Pulse: 61  Resp: 18  Temp: (!) 96 F (35.6 C)  SpO2: 100%   Filed Weights   10/05/22 1055  Weight: 272 lb (123.4 kg)     Physical Exam Constitutional:      General: She is not in acute distress. HENT:     Head: Normocephalic and atraumatic.  Eyes:     General: No scleral icterus. Cardiovascular:     Rate and Rhythm: Normal rate and regular rhythm.  Pulmonary:     Effort: Pulmonary effort is normal. No respiratory distress.  Abdominal:     General: Bowel sounds are normal. There is no distension.     Palpations: Abdomen is  soft.  Musculoskeletal:        General: No deformity. Normal range of motion.     Cervical back: Normal range of motion and neck supple.  Skin:    General: Skin is warm and dry.     Findings: No rash.  Neurological:     Mental Status: She is alert and oriented to person, place, and time. Mental status is at baseline.  Psychiatric:        Mood and Affect: Mood normal.     RADIOGRAPHIC STUDIES: I have personally reviewed the radiological images as listed and agreed with the findings in the report. No results found.   LABORATORY DATA:  I have reviewed the data as listed    Latest Ref Rng & Units 09/14/2022   10:07 AM 08/13/2020    1:53 PM 09/06/2018  2:41 PM  CBC  WBC 4.0 - 10.5 K/uL 3.6  4.1  4.4   Hemoglobin 12.0 - 15.0 g/dL 78.2  95.6  21.3   Hematocrit 36.0 - 46.0 % 39.6  42.7  41.5   Platelets 150 - 400 K/uL 191  235  268       Latest Ref Rng & Units 09/14/2022   10:07 AM 09/06/2018    2:41 PM  CMP  Glucose 70 - 99 mg/dL 89  086   BUN 6 - 20 mg/dL 10  15   Creatinine 5.78 - 1.00 mg/dL 4.69  6.29   Sodium 528 - 145 mmol/L 138  137   Potassium 3.5 - 5.1 mmol/L 3.7  3.4   Chloride 98 - 111 mmol/L 102  105   CO2 22 - 32 mmol/L 27  24   Calcium 8.9 - 10.3 mg/dL 9.0  8.8   Total Protein 6.5 - 8.1 g/dL 7.6    Total Bilirubin 0.3 - 1.2 mg/dL 1.1    Alkaline Phos 38 - 126 U/L 87    AST 15 - 41 U/L 16    ALT 0 - 44 U/L 11         RADIOGRAPHIC STUDIES: I have personally reviewed the radiological images as listed and agreed with the findings in the report. No results found.

## 2022-12-28 ENCOUNTER — Inpatient Hospital Stay: Payer: 59 | Attending: Oncology

## 2022-12-28 DIAGNOSIS — D72819 Decreased white blood cell count, unspecified: Secondary | ICD-10-CM | POA: Insufficient documentation

## 2022-12-28 DIAGNOSIS — D708 Other neutropenia: Secondary | ICD-10-CM

## 2022-12-28 DIAGNOSIS — Z809 Family history of malignant neoplasm, unspecified: Secondary | ICD-10-CM | POA: Insufficient documentation

## 2022-12-28 LAB — CBC WITH DIFFERENTIAL (CANCER CENTER ONLY)
Abs Immature Granulocytes: 0.01 10*3/uL (ref 0.00–0.07)
Basophils Absolute: 0 10*3/uL (ref 0.0–0.1)
Basophils Relative: 1 %
Eosinophils Absolute: 0.1 10*3/uL (ref 0.0–0.5)
Eosinophils Relative: 2 %
HCT: 39.2 % (ref 36.0–46.0)
Hemoglobin: 12.6 g/dL (ref 12.0–15.0)
Immature Granulocytes: 0 %
Lymphocytes Relative: 44 %
Lymphs Abs: 1.8 10*3/uL (ref 0.7–4.0)
MCH: 29.5 pg (ref 26.0–34.0)
MCHC: 32.1 g/dL (ref 30.0–36.0)
MCV: 91.8 fL (ref 80.0–100.0)
Monocytes Absolute: 0.3 10*3/uL (ref 0.1–1.0)
Monocytes Relative: 9 %
Neutro Abs: 1.7 10*3/uL (ref 1.7–7.7)
Neutrophils Relative %: 44 %
Platelet Count: 209 10*3/uL (ref 150–400)
RBC: 4.27 MIL/uL (ref 3.87–5.11)
RDW: 12.6 % (ref 11.5–15.5)
WBC Count: 3.9 10*3/uL — ABNORMAL LOW (ref 4.0–10.5)
nRBC: 0 % (ref 0.0–0.2)

## 2022-12-28 LAB — VITAMIN B12: Vitamin B-12: 338 pg/mL (ref 180–914)

## 2023-01-04 ENCOUNTER — Inpatient Hospital Stay: Payer: 59 | Admitting: Oncology

## 2023-01-04 ENCOUNTER — Encounter: Payer: Self-pay | Admitting: Oncology

## 2023-01-04 VITALS — BP 117/57 | HR 73 | Temp 98.4°F | Ht 65.0 in | Wt 288.6 lb

## 2023-01-04 DIAGNOSIS — D72819 Decreased white blood cell count, unspecified: Secondary | ICD-10-CM | POA: Diagnosis not present

## 2023-01-04 NOTE — Assessment & Plan Note (Addendum)
Labs are reviewed and discussed with patient.   CBC showed slightly decreased total white count, previous work up showed complete normal differential, including normal absolute neutrophil levels.  Negative peripheral blood flow cytometry, normal B12 and folate, negative HIV, negative hepatitis panel.  Normal LDH. Total wbc 3.9 today, slightly improved.  I recommend observation.  No intervention needed.  Borderline B12 level, recommend patient to take vitamin B12 1000 mcg daily.

## 2023-01-04 NOTE — Progress Notes (Signed)
Hematology/Oncology Progress note [phone] [phone]      REFERRING PROVIDER: [facility]   CHIEF COMPLAINTS/REASON FOR VISIT:  Leukopenia   ASSESSMENT & PLAN:   Leukopenia Labs are reviewed and discussed with patient.   CBC showed slightly decreased total white count, previous work up showed complete normal differential, including normal absolute neutrophil levels.  Negative peripheral blood flow cytometry, normal B12 and folate, negative HIV, negative hepatitis panel.  Normal LDH. Total wbc 3.9 today, slightly improved.  I recommend observation.  No intervention needed.  Borderline B12 level, recommend patient to take vitamin B12 1000 mcg daily.  Patient is discharged from my clinic. I recommend patient to continue follow up with primary care physician. Patient may re-establish care in the future if clinically indicated.   All questions were answered. The patient knows to call the clinic with any problems, questions or concerns.  Rickard Patience, MD, PhD [facility] Hematology Oncology 01/04/2023    HISTORY OF PRESENTING ILLNESS:  Mindy Mack is a 57 y.o. female who was seen in consultation at the request of [facility] for evaluation of leukopenia Reviewed patient's recent labs. Patient has low total WBC count was , predominantly neutropenia. Previous lab records reviewed. Leukopenia duration is due to onset.  Patient has a normal white count last year.  Patient denies fatigue, weight loss, fever, chills, frequent infection.  Denies history hepatitis or HIV infection Denies history of chronic liver disease Denies routine alcohol consumption. Denies dietary restrictions.  Denies Herbal medication   INTERVAL HISTORY Mindy Mack is a 57 y.o. female who has above history reviewed by me today presents for follow up visit to discuss results.  No new complaints.   MEDICAL HISTORY:  History reviewed. No pertinent past  medical history.  SURGICAL HISTORY: Past Surgical History:  Procedure Laterality Date   APPENDECTOMY     BACK SURGERY     CESAREAN SECTION     COLONOSCOPY WITH PROPOFOL N/A 01/09/2021   Procedure: COLONOSCOPY WITH PROPOFOL;  Surgeon: Regis Bill, MD;  Location: [facility] ENDOSCOPY;  Service: Endoscopy;  Laterality: N/A;   JOINT REPLACEMENT      SOCIAL HISTORY: Social History   Socioeconomic History   Marital status: Divorced    Spouse name: Not on file   Number of children: Not on file   Years of education: Not on file   Highest education level: Not on file  Occupational History   Not on file  Tobacco Use   Smoking status: Never    Passive exposure: Never   Smokeless tobacco: Never  Vaping Use   Vaping status: Never Used  Substance and Sexual Activity   Alcohol use: Yes    Comment: occasionally   Drug use: No   Sexual activity: Not Currently  Other Topics Concern   Not on file  Social History Narrative   Not on file   Social Determinants of Health   Financial Resource Strain: Low Risk  (09/14/2022)   Overall Financial Resource Strain (CARDIA)    Difficulty of Paying Living Expenses: Not very hard  Food Insecurity: No Food Insecurity (09/14/2022)   Hunger Vital Sign    Worried About Running Out of Food in the Last Year: Never true    Ran Out of Food in the Last Year: Never true  Transportation Needs: No Transportation Needs (09/14/2022)   PRAPARE - Administrator, Civil Service (Medical): No    Lack of Transportation (Non-Medical): No  Physical Activity: Not on file  Stress: Not on file  Social Connections: Not on file  Intimate Partner Violence: Not At Risk (09/14/2022)   Humiliation, Afraid, Rape, and Kick questionnaire    Fear of Current or Ex-Partner: No    Emotionally Abused: No    Physically Abused: No    Sexually Abused: No    FAMILY HISTORY: Family History  Problem Relation Age of Onset   Cancer Mother    Hypertension Mother    Cancer  Father    Dementia Father    Parkinson's disease Father    Cancer Sister    Cancer Brother    Diabetes Paternal Uncle    Cancer Paternal Uncle    Cancer Maternal Grandmother    Diabetes Maternal Grandfather     ALLERGIES:  has No Known Allergies.  MEDICATIONS:  Current Outpatient Medications  Medication Sig Dispense Refill   cyanocobalamin (VITAMIN B12) 1000 MCG tablet Take 1 tablet (1,000 mcg total) by mouth daily. 30 tablet 4   No current facility-administered medications for this visit.     Review of Systems  Constitutional:  Negative for appetite change, chills, fatigue and fever.  HENT:   Negative for hearing loss and voice change.   Eyes:  Negative for eye problems.  Respiratory:  Negative for chest tightness and cough.   Cardiovascular:  Negative for chest pain.  Gastrointestinal:  Negative for abdominal distention, abdominal pain and blood in stool.  Endocrine: Negative for hot flashes.  Genitourinary:  Negative for difficulty urinating and frequency.   Musculoskeletal:  Positive for arthralgias.  Skin:  Negative for itching and rash.  Neurological:  Negative for extremity weakness.  Hematological:  Negative for adenopathy.  Psychiatric/Behavioral:  Negative for confusion.     PHYSICAL EXAMINATION: ECOG PERFORMANCE STATUS: 0 - Asymptomatic Vitals:   01/04/23 1047  BP: (!) 117/57  Pulse: 73  Temp: 98.4 F (36.9 C)  SpO2: 99%   Filed Weights   01/04/23 1047  Weight: 288 lb 9.6 oz (130.9 kg)     Physical Exam Constitutional:      General: She is not in acute distress. HENT:     Head: Normocephalic and atraumatic.  Eyes:     General: No scleral icterus. Cardiovascular:     Rate and Rhythm: Normal rate and regular rhythm.  Pulmonary:     Effort: Pulmonary effort is normal. No respiratory distress.  Abdominal:     General: Bowel sounds are normal. There is no distension.     Palpations: Abdomen is soft.  Musculoskeletal:        General: No  deformity. Normal range of motion.     Cervical back: Normal range of motion and neck supple.  Skin:    General: Skin is warm and dry.     Findings: No rash.  Neurological:     Mental Status: She is alert and oriented to person, place, and time. Mental status is at baseline.  Psychiatric:        Mood and Affect: Mood normal.     RADIOGRAPHIC STUDIES: I have personally reviewed the radiological images as listed and agreed with the findings in the report. No results found.   LABORATORY DATA:  I have reviewed the data as listed    Latest Ref Rng & Units 12/28/2022   10:48 AM 09/14/2022   10:07 AM 08/13/2020    1:53 PM  CBC  WBC 4.0 - 10.5 K/uL 3.9  3.6  4.1   Hemoglobin 12.0 -  15.0 g/dL 16.1  09.6  04.5   Hematocrit 36.0 - 46.0 % 39.2  39.6  42.7   Platelets 150 - 400 K/uL 209  191  235       Latest Ref Rng & Units 09/14/2022   10:07 AM 09/06/2018    2:41 PM  CMP  Glucose 70 - 99 mg/dL 89  409   BUN 6 - 20 mg/dL 10  15   Creatinine 8.11 - 1.00 mg/dL 9.14  7.82   Sodium 956 - 145 mmol/L 138  137   Potassium 3.5 - 5.1 mmol/L 3.7  3.4   Chloride 98 - 111 mmol/L 102  105   CO2 22 - 32 mmol/L 27  24   Calcium 8.9 - 10.3 mg/dL 9.0  8.8   Total Protein 6.5 - 8.1 g/dL 7.6    Total Bilirubin 0.3 - 1.2 mg/dL 1.1    Alkaline Phos 38 - 126 U/L 87    AST 15 - 41 U/L 16    ALT 0 - 44 U/L 11         RADIOGRAPHIC STUDIES: I have personally reviewed the radiological images as listed and agreed with the findings in the report. No results found.

## 2023-02-21 ENCOUNTER — Emergency Department: Payer: 59

## 2023-02-21 ENCOUNTER — Emergency Department
Admission: EM | Admit: 2023-02-21 | Discharge: 2023-02-21 | Disposition: A | Discharge status: Home / Self Care | Payer: 59 | Attending: Emergency Medicine | Admitting: Emergency Medicine

## 2023-02-21 ENCOUNTER — Other Ambulatory Visit: Payer: Self-pay

## 2023-02-21 ENCOUNTER — Encounter: Payer: Self-pay | Admitting: Emergency Medicine

## 2023-02-21 DIAGNOSIS — R071 Chest pain on breathing: Secondary | ICD-10-CM

## 2023-02-21 DIAGNOSIS — R079 Chest pain, unspecified: Secondary | ICD-10-CM | POA: Insufficient documentation

## 2023-02-21 LAB — CBC WITH DIFFERENTIAL/PLATELET
Abs Immature Granulocytes: 0.01 10*3/uL (ref 0.00–0.07)
Basophils Absolute: 0 10*3/uL (ref 0.0–0.1)
Basophils Relative: 1 %
Eosinophils Absolute: 0.1 10*3/uL (ref 0.0–0.5)
Eosinophils Relative: 2 %
HCT: 40.9 % (ref 36.0–46.0)
Hemoglobin: 13.3 g/dL (ref 12.0–15.0)
Immature Granulocytes: 0 %
Lymphocytes Relative: 38 %
Lymphs Abs: 1.7 10*3/uL (ref 0.7–4.0)
MCH: 29.8 pg (ref 26.0–34.0)
MCHC: 32.5 g/dL (ref 30.0–36.0)
MCV: 91.7 fL (ref 80.0–100.0)
Monocytes Absolute: 0.4 10*3/uL (ref 0.1–1.0)
Monocytes Relative: 9 %
Neutro Abs: 2.2 10*3/uL (ref 1.7–7.7)
Neutrophils Relative %: 50 %
Platelets: 215 10*3/uL (ref 150–400)
RBC: 4.46 MIL/uL (ref 3.87–5.11)
RDW: 12.4 % (ref 11.5–15.5)
WBC: 4.4 10*3/uL (ref 4.0–10.5)
nRBC: 0 % (ref 0.0–0.2)

## 2023-02-21 LAB — BASIC METABOLIC PANEL
Anion gap: 8 (ref 5–15)
BUN: 12 mg/dL (ref 6–20)
CO2: 23 mmol/L (ref 22–32)
Calcium: 8.5 mg/dL — ABNORMAL LOW (ref 8.9–10.3)
Chloride: 108 mmol/L (ref 98–111)
Creatinine, Ser: 0.46 mg/dL (ref 0.44–1.00)
GFR, Estimated: >60 mL/min (ref >60)
Glucose, Bld: 106 mg/dL — ABNORMAL HIGH (ref 70–99)
Potassium: 3.3 mmol/L — ABNORMAL LOW (ref 3.5–5.1)
Sodium: 139 mmol/L (ref 135–145)

## 2023-02-21 LAB — TROPONIN I (HIGH SENSITIVITY)
Troponin I (High Sensitivity): <2 ng/L (ref <18)
Troponin I (High Sensitivity): 3 ng/L (ref <18)

## 2023-02-21 MED ORDER — CYCLOBENZAPRINE HCL 5 MG PO TABS: 5.0000 mg | ORAL_TABLET | Freq: Three times a day (TID) | ORAL | 0 refills | Status: AC | PRN

## 2023-02-21 MED ORDER — MELOXICAM 15 MG PO TABS: 15.0000 mg | ORAL_TABLET | Freq: Every day | ORAL | 0 refills | Status: AC

## 2023-02-21 NOTE — ED Provider Notes (Signed)
[facility] Provider Note   Event Date/Time   First MD Initiated Contact with Patient 02/21/23 1655     (approximate) History  Chest Pain  HPI Mindy Mack is a 57 y.o. female with no stated past medical history presents for chest pain that started yesterday while she was working move large boxes.  Patient states that his central, nonradiating, and worse when taking a deep breath or with movement. ROS: Patient currently denies any vision changes, tinnitus, difficulty speaking, facial droop, sore throat, shortness of breath, abdominal pain, nausea/vomiting/diarrhea, dysuria, or weakness/numbness/paresthesias in any extremity   Physical Exam  Triage Vital Signs: ED Triage Vitals [02/21/23 1406]  Encounter Vitals Group     BP (!) 146/79     Systolic BP Percentile      Diastolic BP Percentile      Pulse Rate 69     Resp 18     Temp 97.9 F (36.6 C)     Temp Source Oral     SpO2 96 %     Weight 290 lb (131.5 kg)     Height 5\' 5"  (1.651 m)     Head Circumference      Peak Flow      Pain Score 8     Pain Loc      Pain Education      Exclude from Growth Chart    Most recent vital signs: Vitals:   02/21/23 1406 02/21/23 1650  BP: (!) 146/79 127/71  Pulse: 69 78  Resp: 18 18  Temp: 97.9 F (36.6 C) 97.8 F (36.6 C)  SpO2: 96% 99%   General: Awake, oriented x4. CV:  Good peripheral perfusion.  Resp:  Normal effort.  Abd:  No distention.  Other:  Middle-aged obese African-American female resting comfortably in no acute distress.  Tender to palpation over right sternal border ED Results / Procedures / Treatments  Labs (all labs ordered are listed, but only abnormal results are displayed) Labs Reviewed  BASIC METABOLIC PANEL - Abnormal; Notable for the following components:      Result Value   Potassium 3.3 (*)    Glucose, Bld 106 (*)    Calcium 8.5 (*)    All other components within normal limits  CBC WITH DIFFERENTIAL/PLATELET   TROPONIN I (HIGH SENSITIVITY)  TROPONIN I (HIGH SENSITIVITY)   EKG ED ECG REPORT I, Merwyn Katos, the attending physician, personally viewed and interpreted this ECG. Date: 02/21/2023 EKG Time: 1358 Rate: 69 Rhythm: normal sinus rhythm QRS Axis: normal Intervals: normal ST/T Wave abnormalities: normal Narrative Interpretation: no evidence of acute ischemia RADIOLOGY ED MD interpretation: 2 view chest x-ray interpreted by me shows no evidence of acute abnormalities including no pneumonia, pneumothorax, or widened mediastinum -Agree with radiology assessment Official radiology report(s): DG Chest 2 View Result Date: 02/21/2023 CLINICAL DATA:  Chest pressure EXAM: CHEST - 2 VIEW COMPARISON:  None Available. FINDINGS: Normal mediastinum and cardiac silhouette. Normal pulmonary vasculature. No evidence of effusion, infiltrate, or pneumothorax. No acute bony abnormality. IMPRESSION: No acute cardiopulmonary process. Electronically Signed   By: Genevive Bi M.D.   On: 02/21/2023 16:13   PROCEDURES: Critical Care performed: No Procedures MEDICATIONS ORDERED IN ED: Medications - No data to display IMPRESSION / MDM / ASSESSMENT AND PLAN / ED COURSE  I reviewed the triage vital signs and the nursing notes.  The patient is on the cardiac monitor to evaluate for evidence of arrhythmia and/or significant heart rate changes. Patient's presentation is most consistent with acute presentation with potential threat to life or bodily function. This patient presents with atypical chest pain, most likely secondary to musculoskeletal injury. Differential diagnosis includes rib fracture, costochondritis, sternal fracture. Low suspicion for ACS, acute PE (PERC negative), pericarditis / myocarditis, thoracic aortic dissection, pneumothorax, pneumonia or other acute infectious process. Presentation not consistent with other acute, emergent causes of chest pain at this time.  No indication for cardiac enzyme testing. Plan to order CXR to evaluate for acute cardiopulmonary causes.  Plan: EKG, CXR, pain control  Dispo: Discharge home with home care   FINAL CLINICAL IMPRESSION(S) / ED DIAGNOSES   Final diagnoses:  Chest pain on breathing   Rx / DC Orders   ED Discharge Orders          Ordered    meloxicam (MOBIC) 15 MG tablet  Daily        02/21/23 1717    cyclobenzaprine (FLEXERIL) 5 MG tablet  3 times daily PRN        02/21/23 1717           Note:  This document was prepared using Dragon voice recognition software and may include unintentional dictation errors.   Merwyn Katos, MD 02/21/23 [phone]

## 2023-02-21 NOTE — ED Triage Notes (Signed)
Patient to ED via POV for CP that started yesterday at work. Centralized, non-radiating pain. Denies cardiac hx. Worse with movement.

## 2023-02-21 NOTE — ED Provider Triage Note (Signed)
Emergency Medicine Provider Triage Evaluation Note  Mindy Mack , a 57 y.o. female  was evaluated in triage.  Pt complains of chest pressure since last night but it has been getting worse throughout the day. Also has pain when taking a deep breath.  Review of Systems  Positive: Chest pressure,  Negative: SOB, dizziness, light headed, cough  Physical Exam  LMP 05/21/2018  Gen:   Awake, no distress   Resp:  Normal effort  MSK:   Moves extremities without difficulty  Other:    Medical Decision Making  Medically screening exam initiated at 2:00 PM.  Appropriate orders placed.  Mindy Mack was informed that the remainder of the evaluation will be completed by another provider, this initial triage assessment does not replace that evaluation, and the importance of remaining in the ED until their evaluation is complete.     Mindy Ali, PA-C 02/21/23 1402
